# Patient Record
Sex: Female | Born: 1960 | Race: White | Hispanic: No | Smoking: Former smoker
Health system: Southern US, Community
[De-identification: ages and names within clinical notes are randomized; demographics above are authoritative.]

## PROBLEM LIST (undated history)

## (undated) DIAGNOSIS — I219 Acute myocardial infarction, unspecified: Secondary | ICD-10-CM

## (undated) DIAGNOSIS — K219 Gastro-esophageal reflux disease without esophagitis: Secondary | ICD-10-CM

## (undated) DIAGNOSIS — F319 Bipolar disorder, unspecified: Secondary | ICD-10-CM

## (undated) DIAGNOSIS — I10 Essential (primary) hypertension: Secondary | ICD-10-CM

## (undated) DIAGNOSIS — E785 Hyperlipidemia, unspecified: Secondary | ICD-10-CM

## (undated) DIAGNOSIS — F410 Panic disorder [episodic paroxysmal anxiety] without agoraphobia: Secondary | ICD-10-CM

## (undated) HISTORY — PX: ABDOMINAL HYSTERECTOMY: SHX81

## (undated) HISTORY — PX: TONSILLECTOMY: SUR1361

## (undated) HISTORY — PX: CARDIAC CATHETERIZATION: SHX172

## (undated) HISTORY — PX: HERNIA REPAIR: SHX51

## (undated) HISTORY — PX: OTHER SURGICAL HISTORY: SHX169

---

## 2001-10-09 ENCOUNTER — Ambulatory Visit (HOSPITAL_COMMUNITY): Admission: RE | Admit: 2001-10-09 | Discharge: 2001-10-09 | Payer: Self-pay | Admitting: *Deleted

## 2002-03-15 ENCOUNTER — Other Ambulatory Visit: Admission: RE | Admit: 2002-03-15 | Discharge: 2002-03-15 | Payer: Self-pay | Admitting: *Deleted

## 2002-08-17 ENCOUNTER — Encounter: Payer: Self-pay | Admitting: *Deleted

## 2002-08-17 ENCOUNTER — Ambulatory Visit (HOSPITAL_COMMUNITY): Admission: RE | Admit: 2002-08-17 | Discharge: 2002-08-17 | Payer: Self-pay | Admitting: *Deleted

## 2005-01-03 ENCOUNTER — Ambulatory Visit: Payer: Self-pay | Admitting: Internal Medicine

## 2005-08-05 ENCOUNTER — Ambulatory Visit: Payer: Self-pay | Admitting: Gynecology

## 2006-04-25 ENCOUNTER — Emergency Department: Payer: Self-pay | Admitting: Emergency Medicine

## 2006-05-19 ENCOUNTER — Ambulatory Visit: Payer: Self-pay | Admitting: Gynecology

## 2006-06-02 ENCOUNTER — Ambulatory Visit: Payer: Self-pay | Admitting: Gynecology

## 2006-06-23 ENCOUNTER — Ambulatory Visit: Payer: Self-pay | Admitting: Gynecology

## 2006-07-21 ENCOUNTER — Ambulatory Visit: Payer: Self-pay | Admitting: Gynecology

## 2006-08-19 ENCOUNTER — Ambulatory Visit: Payer: Self-pay | Admitting: Family Medicine

## 2006-09-02 ENCOUNTER — Ambulatory Visit: Payer: Self-pay | Admitting: Family Medicine

## 2006-09-02 ENCOUNTER — Inpatient Hospital Stay (HOSPITAL_COMMUNITY): Admission: AD | Admit: 2006-09-02 | Discharge: 2006-09-03 | Payer: Self-pay | Admitting: Obstetrics and Gynecology

## 2006-09-15 ENCOUNTER — Ambulatory Visit: Payer: Self-pay | Admitting: Gynecology

## 2006-09-23 ENCOUNTER — Ambulatory Visit: Payer: Self-pay | Admitting: Family Medicine

## 2006-09-27 ENCOUNTER — Inpatient Hospital Stay (HOSPITAL_COMMUNITY): Admission: AD | Admit: 2006-09-27 | Discharge: 2006-09-27 | Payer: Self-pay | Admitting: Obstetrics & Gynecology

## 2006-09-29 ENCOUNTER — Ambulatory Visit: Payer: Self-pay | Admitting: Gynecology

## 2006-10-06 ENCOUNTER — Ambulatory Visit: Payer: Self-pay | Admitting: Gynecology

## 2006-10-13 ENCOUNTER — Ambulatory Visit: Payer: Self-pay | Admitting: Gynecology

## 2006-10-16 ENCOUNTER — Inpatient Hospital Stay (HOSPITAL_COMMUNITY): Admission: AD | Admit: 2006-10-16 | Discharge: 2006-10-18 | Payer: Self-pay | Admitting: Family Medicine

## 2006-10-16 ENCOUNTER — Ambulatory Visit: Payer: Self-pay | Admitting: Obstetrics & Gynecology

## 2006-10-16 ENCOUNTER — Ambulatory Visit: Payer: Self-pay | Admitting: Family Medicine

## 2006-10-18 ENCOUNTER — Ambulatory Visit: Payer: Self-pay | Admitting: Neonatology

## 2006-10-21 ENCOUNTER — Ambulatory Visit: Payer: Self-pay | Admitting: Family Medicine

## 2006-10-24 ENCOUNTER — Ambulatory Visit: Payer: Self-pay | Admitting: Obstetrics & Gynecology

## 2006-10-28 ENCOUNTER — Ambulatory Visit: Payer: Self-pay | Admitting: Obstetrics and Gynecology

## 2006-10-31 ENCOUNTER — Ambulatory Visit: Payer: Self-pay | Admitting: Gynecology

## 2006-11-04 ENCOUNTER — Ambulatory Visit: Payer: Self-pay | Admitting: Obstetrics and Gynecology

## 2006-11-07 ENCOUNTER — Ambulatory Visit: Payer: Self-pay | Admitting: Gynecology

## 2006-11-11 ENCOUNTER — Ambulatory Visit (HOSPITAL_COMMUNITY): Admission: RE | Admit: 2006-11-11 | Discharge: 2006-11-11 | Payer: Self-pay | Admitting: Obstetrics and Gynecology

## 2006-11-11 ENCOUNTER — Ambulatory Visit: Payer: Self-pay | Admitting: Obstetrics and Gynecology

## 2006-11-14 ENCOUNTER — Ambulatory Visit: Payer: Self-pay | Admitting: Obstetrics & Gynecology

## 2006-11-14 ENCOUNTER — Ambulatory Visit: Payer: Self-pay | Admitting: Gynecology

## 2006-11-14 ENCOUNTER — Inpatient Hospital Stay (HOSPITAL_COMMUNITY): Admission: AD | Admit: 2006-11-14 | Discharge: 2006-11-17 | Payer: Self-pay | Admitting: Gynecology

## 2006-12-27 ENCOUNTER — Emergency Department: Payer: Self-pay | Admitting: Emergency Medicine

## 2007-01-05 ENCOUNTER — Ambulatory Visit: Payer: Self-pay | Admitting: Gynecology

## 2007-01-09 ENCOUNTER — Encounter: Admission: RE | Admit: 2007-01-09 | Discharge: 2007-01-09 | Payer: Self-pay | Admitting: Gynecology

## 2009-07-13 ENCOUNTER — Ambulatory Visit: Payer: Self-pay | Admitting: Obstetrics and Gynecology

## 2009-07-17 ENCOUNTER — Ambulatory Visit: Payer: Self-pay | Admitting: Obstetrics and Gynecology

## 2011-01-19 ENCOUNTER — Encounter: Payer: Self-pay | Admitting: Gynecology

## 2011-02-19 ENCOUNTER — Ambulatory Visit: Payer: Self-pay | Admitting: Unknown Physician Specialty

## 2017-07-16 ENCOUNTER — Emergency Department: Payer: Medicare Other

## 2017-07-16 ENCOUNTER — Encounter: Payer: Self-pay | Admitting: Emergency Medicine

## 2017-07-16 ENCOUNTER — Inpatient Hospital Stay
Admission: EM | Admit: 2017-07-16 | Discharge: 2017-07-18 | DRG: 247 | Disposition: A | Discharge status: Home / Self Care | Payer: Medicare Other | Attending: Internal Medicine | Admitting: Internal Medicine

## 2017-07-16 DIAGNOSIS — R0789 Other chest pain: Secondary | ICD-10-CM | POA: Diagnosis present

## 2017-07-16 DIAGNOSIS — F319 Bipolar disorder, unspecified: Secondary | ICD-10-CM | POA: Diagnosis present

## 2017-07-16 DIAGNOSIS — F410 Panic disorder [episodic paroxysmal anxiety] without agoraphobia: Secondary | ICD-10-CM | POA: Diagnosis present

## 2017-07-16 DIAGNOSIS — E669 Obesity, unspecified: Secondary | ICD-10-CM | POA: Diagnosis present

## 2017-07-16 DIAGNOSIS — K219 Gastro-esophageal reflux disease without esophagitis: Secondary | ICD-10-CM | POA: Diagnosis present

## 2017-07-16 DIAGNOSIS — Z888 Allergy status to other drugs, medicaments and biological substances status: Secondary | ICD-10-CM

## 2017-07-16 DIAGNOSIS — F419 Anxiety disorder, unspecified: Secondary | ICD-10-CM | POA: Diagnosis present

## 2017-07-16 DIAGNOSIS — Z880 Allergy status to penicillin: Secondary | ICD-10-CM | POA: Diagnosis not present

## 2017-07-16 DIAGNOSIS — I214 Non-ST elevation (NSTEMI) myocardial infarction: Principal | ICD-10-CM | POA: Diagnosis present

## 2017-07-16 DIAGNOSIS — I10 Essential (primary) hypertension: Secondary | ICD-10-CM | POA: Diagnosis present

## 2017-07-16 DIAGNOSIS — Z87891 Personal history of nicotine dependence: Secondary | ICD-10-CM | POA: Diagnosis not present

## 2017-07-16 DIAGNOSIS — E785 Hyperlipidemia, unspecified: Secondary | ICD-10-CM | POA: Diagnosis present

## 2017-07-16 DIAGNOSIS — N3000 Acute cystitis without hematuria: Secondary | ICD-10-CM | POA: Diagnosis present

## 2017-07-16 DIAGNOSIS — Z6834 Body mass index (BMI) 34.0-34.9, adult: Secondary | ICD-10-CM

## 2017-07-16 DIAGNOSIS — F411 Generalized anxiety disorder: Secondary | ICD-10-CM | POA: Diagnosis present

## 2017-07-16 DIAGNOSIS — Z79899 Other long term (current) drug therapy: Secondary | ICD-10-CM

## 2017-07-16 DIAGNOSIS — R109 Unspecified abdominal pain: Secondary | ICD-10-CM

## 2017-07-16 HISTORY — DX: Bipolar disorder, unspecified: F31.9

## 2017-07-16 HISTORY — DX: Gastro-esophageal reflux disease without esophagitis: K21.9

## 2017-07-16 HISTORY — DX: Essential (primary) hypertension: I10

## 2017-07-16 HISTORY — DX: Panic disorder (episodic paroxysmal anxiety): F41.0

## 2017-07-16 LAB — COMPREHENSIVE METABOLIC PANEL
ALBUMIN: 4.4 g/dL (ref 3.5–5.0)
ALT: 54 U/L (ref 14–54)
ANION GAP: 9 (ref 5–15)
AST: 61 U/L — ABNORMAL HIGH (ref 15–41)
Alkaline Phosphatase: 122 U/L (ref 38–126)
BUN: 14 mg/dL (ref 6–20)
CHLORIDE: 107 mmol/L (ref 101–111)
CO2: 25 mmol/L (ref 22–32)
CREATININE: 1.01 mg/dL — AB (ref 0.44–1.00)
Calcium: 9.5 mg/dL (ref 8.9–10.3)
GFR calc non Af Amer: >60 mL/min (ref >60)
Glucose, Bld: 113 mg/dL — ABNORMAL HIGH (ref 65–99)
Potassium: 4.5 mmol/L (ref 3.5–5.1)
SODIUM: 141 mmol/L (ref 135–145)
Total Bilirubin: 1.1 mg/dL (ref 0.3–1.2)
Total Protein: 7.9 g/dL (ref 6.5–8.1)

## 2017-07-16 LAB — TSH: TSH: 2.296 u[IU]/mL (ref 0.350–4.500)

## 2017-07-16 LAB — TROPONIN I
TROPONIN I: 2.87 ng/mL — AB (ref <0.03)
TROPONIN I: 2.99 ng/mL — AB (ref <0.03)
Troponin I: 2.99 ng/mL (ref <0.03)
Troponin I: 2.2 ng/mL (ref <0.03)

## 2017-07-16 LAB — URINALYSIS, COMPLETE (UACMP) WITH MICROSCOPIC
Bilirubin Urine: NEGATIVE
GLUCOSE, UA: NEGATIVE mg/dL
KETONES UR: NEGATIVE mg/dL
Nitrite: NEGATIVE
PROTEIN: NEGATIVE mg/dL
Specific Gravity, Urine: 1.008 (ref 1.005–1.030)
pH: 7 (ref 5.0–8.0)

## 2017-07-16 LAB — CBC
HCT: 43.4 % (ref 35.0–47.0)
Hemoglobin: 14.9 g/dL (ref 12.0–16.0)
MCH: 29.3 pg (ref 26.0–34.0)
MCHC: 34.3 g/dL (ref 32.0–36.0)
MCV: 85.3 fL (ref 80.0–100.0)
PLATELETS: 309 10*3/uL (ref 150–440)
RBC: 5.08 MIL/uL (ref 3.80–5.20)
RDW: 13.3 % (ref 11.5–14.5)
WBC: 11.4 10*3/uL — ABNORMAL HIGH (ref 3.6–11.0)

## 2017-07-16 LAB — HEPARIN LEVEL (UNFRACTIONATED)
HEPARIN UNFRACTIONATED: 0.5 [IU]/mL (ref 0.30–0.70)
Heparin Unfractionated: 0.38 IU/mL (ref 0.30–0.70)

## 2017-07-16 LAB — PROTIME-INR
INR: 0.99
PROTHROMBIN TIME: 13.1 s (ref 11.4–15.2)

## 2017-07-16 LAB — APTT: aPTT: 28 seconds (ref 24–36)

## 2017-07-16 LAB — LIPASE, BLOOD: Lipase: 32 U/L (ref 11–51)

## 2017-07-16 MED ORDER — LORAZEPAM 0.5 MG PO TABS
0.5000 mg | ORAL_TABLET | Freq: Once | ORAL | Status: AC
  Administered 2017-07-16: 0.5 mg via ORAL
  Filled 2017-07-16: qty 1

## 2017-07-16 MED ORDER — ATORVASTATIN CALCIUM 20 MG PO TABS
80.0000 mg | ORAL_TABLET | Freq: Every day | ORAL | Status: DC
  Administered 2017-07-16 – 2017-07-17 (×2): 80 mg via ORAL
  Filled 2017-07-16 (×2): qty 4

## 2017-07-16 MED ORDER — FAMOTIDINE 20 MG PO TABS
20.0000 mg | ORAL_TABLET | Freq: Two times a day (BID) | ORAL | Status: DC
  Administered 2017-07-16 – 2017-07-18 (×5): 20 mg via ORAL
  Filled 2017-07-16 (×5): qty 1

## 2017-07-16 MED ORDER — CIPROFLOXACIN IN D5W 400 MG/200ML IV SOLN
400.0000 mg | Freq: Once | INTRAVENOUS | Status: AC
  Administered 2017-07-16: 400 mg via INTRAVENOUS
  Filled 2017-07-16: qty 200

## 2017-07-16 MED ORDER — CIPROFLOXACIN IN D5W 400 MG/200ML IV SOLN
400.0000 mg | Freq: Two times a day (BID) | INTRAVENOUS | Status: DC
Start: 2017-07-16 — End: 2017-07-16

## 2017-07-16 MED ORDER — DIVALPROEX SODIUM ER 250 MG PO TB24
250.0000 mg | ORAL_TABLET | Freq: Every day | ORAL | Status: DC
  Administered 2017-07-16 – 2017-07-18 (×3): 250 mg via ORAL
  Filled 2017-07-16 (×4): qty 1

## 2017-07-16 MED ORDER — METOPROLOL TARTRATE 25 MG PO TABS
25.0000 mg | ORAL_TABLET | Freq: Two times a day (BID) | ORAL | Status: DC
  Administered 2017-07-16 – 2017-07-17 (×3): 25 mg via ORAL
  Filled 2017-07-16 (×5): qty 1

## 2017-07-16 MED ORDER — HEPARIN BOLUS VIA INFUSION
4000.0000 [IU] | Freq: Once | INTRAVENOUS | Status: AC
  Administered 2017-07-16: 4000 [IU] via INTRAVENOUS
  Filled 2017-07-16: qty 4000

## 2017-07-16 MED ORDER — PANTOPRAZOLE SODIUM 40 MG PO TBEC
40.0000 mg | DELAYED_RELEASE_TABLET | Freq: Two times a day (BID) | ORAL | Status: DC
  Administered 2017-07-16 – 2017-07-18 (×5): 40 mg via ORAL
  Filled 2017-07-16 (×5): qty 1

## 2017-07-16 MED ORDER — SODIUM CHLORIDE 0.9 % IV SOLN
INTRAVENOUS | Status: DC
  Administered 2017-07-16 – 2017-07-18 (×3): via INTRAVENOUS

## 2017-07-16 MED ORDER — ASPIRIN 81 MG PO CHEW
324.0000 mg | CHEWABLE_TABLET | Freq: Once | ORAL | Status: AC
  Administered 2017-07-16: 324 mg via ORAL
  Filled 2017-07-16: qty 4

## 2017-07-16 MED ORDER — HEPARIN (PORCINE) IN NACL 100-0.45 UNIT/ML-% IJ SOLN
1000.0000 [IU]/h | INTRAMUSCULAR | Status: DC
  Administered 2017-07-16 – 2017-07-17 (×3): 1000 [IU]/h via INTRAVENOUS
  Filled 2017-07-16 (×2): qty 250

## 2017-07-16 MED ORDER — HYDROCODONE-ACETAMINOPHEN 5-325 MG PO TABS: 1.0000 | ORAL_TABLET | ORAL | Status: DC | PRN

## 2017-07-16 MED ORDER — CIPROFLOXACIN IN D5W 400 MG/200ML IV SOLN
400.0000 mg | Freq: Two times a day (BID) | INTRAVENOUS | Status: DC
  Administered 2017-07-16 – 2017-07-18 (×4): 400 mg via INTRAVENOUS
  Filled 2017-07-16 (×5): qty 200

## 2017-07-16 MED ORDER — ONDANSETRON HCL 4 MG PO TABS: 4.0000 mg | ORAL_TABLET | Freq: Four times a day (QID) | ORAL | Status: DC | PRN

## 2017-07-16 MED ORDER — ATORVASTATIN CALCIUM 20 MG PO TABS: 40.0000 mg | ORAL_TABLET | Freq: Every day | ORAL | Status: DC

## 2017-07-16 MED ORDER — SODIUM CHLORIDE 0.9 % IV BOLUS (SEPSIS)
1000.0000 mL | Freq: Once | INTRAVENOUS | Status: AC
  Administered 2017-07-16: 1000 mL via INTRAVENOUS

## 2017-07-16 MED ORDER — CLONAZEPAM 0.5 MG PO TABS
0.2500 mg | ORAL_TABLET | Freq: Two times a day (BID) | ORAL | Status: DC
  Administered 2017-07-16 – 2017-07-17 (×3): 0.25 mg via ORAL
  Filled 2017-07-16 (×5): qty 1

## 2017-07-16 MED ORDER — ACETAMINOPHEN 325 MG PO TABS: 650.0000 mg | ORAL_TABLET | Freq: Four times a day (QID) | ORAL | Status: DC | PRN

## 2017-07-16 MED ORDER — ASPIRIN EC 325 MG PO TBEC
325.0000 mg | DELAYED_RELEASE_TABLET | Freq: Every day | ORAL | Status: DC
  Filled 2017-07-16: qty 1

## 2017-07-16 MED ORDER — LORAZEPAM 2 MG/ML IJ SOLN
2.0000 mg | INTRAMUSCULAR | Status: DC | PRN
  Administered 2017-07-16 – 2017-07-17 (×2): 2 mg via INTRAVENOUS
  Filled 2017-07-16 (×2): qty 1

## 2017-07-16 MED ORDER — ONDANSETRON HCL 4 MG/2ML IJ SOLN: 4.0000 mg | Freq: Four times a day (QID) | INTRAMUSCULAR | Status: DC | PRN

## 2017-07-16 MED ORDER — LORAZEPAM 1 MG PO TABS
1.0000 mg | ORAL_TABLET | Freq: Once | ORAL | Status: AC
  Administered 2017-07-16: 1 mg via ORAL
  Filled 2017-07-16: qty 1

## 2017-07-16 MED ORDER — ACETAMINOPHEN 650 MG RE SUPP: 650.0000 mg | Freq: Four times a day (QID) | RECTAL | Status: DC | PRN

## 2017-07-16 NOTE — Progress Notes (Signed)
CH made a follow up with pt whom the [facility] of the unit had met earlier and educated about AD. Pt was asleep at the time of this visit. CH was not able to talk to pt and will plan another follow up visit as needed.   07/16/17 1300  Clinical Encounter Type  Visited With Patient  Visit Type Follow-up  Referral From Chaplain  Consult/Referral To Chaplain  Spiritual Encounters  Spiritual Needs Other (Comment)

## 2017-07-16 NOTE — ED Notes (Signed)
Patient assisted to use restroom. Steady gait noted. Patient reports no further needs at this time.

## 2017-07-16 NOTE — ED Triage Notes (Signed)
Pt here for intermittent episodes of sweating followed by epigastric pain that radiates to back. Sx X 2 days. Eases with ambulation

## 2017-07-16 NOTE — Progress Notes (Signed)
   07/16/17 1139  Clinical Encounter Type  Visited With Patient;Family;Health care provider  Visit Type Initial;Spiritual support  Referral From Chaplain;Nurse   [facility] responded to request for AD. Patient was with staff, family, pastor, and children. [place] spoke with the patient and nurse will call [place] later to visit.

## 2017-07-16 NOTE — Progress Notes (Signed)
ANTICOAGULATION CONSULT NOTE - Initial Consult  Pharmacy Consult for heparin Indication: chest pain/ACS  Allergies  Allergen Reactions  . Other Anaphylaxis    Ex lax  . Penicillins Rash    .Has patient had a PCN reaction causing immediate rash, facial/tongue/throat swelling, SOB or lightheadedness with hypotension: No Has patient had a PCN reaction causing severe rash involving mucus membranes or skin necrosis: No Has patient had a PCN reaction that required hospitalization: No Has patient had a PCN reaction occurring within the last 10 years: No If all of the above answers are "NO", then may proceed with Cephalosporin use.     Patient Measurements: Height: 5\' 3"  (160 cm) Weight: 193 lb (87.5 kg) IBW/kg (Calculated) : 52.4 Heparin Dosing Weight: 72.1 kg  Vital Signs: Temp: 97.9 F (36.6 C) (07/18 1155) Temp Source: Oral (07/18 1155) BP: 137/72 (07/18 1155) Pulse Rate: 98 (07/18 1155)  Labs:  Recent Labs  07/16/17 0827 07/16/17 1007 07/16/17 1047 07/16/17 1132 07/16/17 1713  HGB 14.9  --   --   --   --   HCT 43.4  --   --   --   --   PLT 309  --   --   --   --   APTT  --   --  28  --   --   LABPROT  --   --  13.1  --   --   INR  --   --  0.99  --   --   HEPARINUNFRC  --   --   --   --  0.50  CREATININE 1.01*  --   --   --   --   TROPONINI 2.87* 2.99*  --  2.99* 2.20*    Estimated Creatinine Clearance: 63.6 mL/min (A) (by C-G formula based on SCr of 1.01 mg/dL (H)).   Medical History: Past Medical History:  Diagnosis Date  . Bipolar affective disorder ([place])   . GERD (gastroesophageal reflux disease)   . Hypertension   . Panic attack     Medications:  Infusions:  . sodium chloride 75 mL/hr at 07/16/17 1229  . ciprofloxacin    . heparin 1,000 Units/hr (07/16/17 1600)    Assessment: 56 yof cc abdominal pain (presenting with GERD like reflux symptoms: burning x 2 days, aching substernal pain radiating to back). PMH bipolar affective disorder,ax, GERD.  Troponin in ED 2.87 x 1. Pharmacy consulted to dose UFH for ACS. No OAC on PTA list.   Goal of Therapy:  Heparin level 0.3-0.7 units/ml Monitor platelets by anticoagulation protocol: Yes   Plan:  Continue heparin infusion at current rate and recheck a HL in 6 hours.   Ulice Dash D, Pharm.D., BCPS Clinical Pharmacist 07/16/2017,6:33 PM

## 2017-07-16 NOTE — ED Notes (Signed)
Pt assisted to the toilet. Pt advised that she needed something for her nerves and pt stated that she is burping a lot again. Nurse advised

## 2017-07-16 NOTE — ED Provider Notes (Addendum)
[facility] Emergency Department Provider Note  ____________________________________________  Time seen: Approximately 8:04 AM  I have reviewed the triage vital signs and the nursing notes.   HISTORY  Chief Complaint Abdominal Pain    HPI Amber Mills is a 56 y.o. female with a history of difficult to control GERD, obesity, HTN, anxiety and bipolar affective disorder presenting with chest pain. The patient reports that 2 nights ago she turned over on her side when she developed a central "deep hurting" pain in the chest.She did not have any associated diaphoresis, radiation, pleuritic component, shortness of breath, palpitations, lightheadedness or syncope. The pain resolved when she sat up. This morning at 4:30 AM, the patient and related to the bathroom and developed a similar pain that then again self resolved. At this time, the patient is pain-free. She describes that she has difficult to control GERD, and has 5-6 episodes daily of a burning epigastric sensation; the pain for which she presents is not similar to this. The patient has not had any risk stratification studies in the last 12 months. She has undergone endoscopy, which she reports was normal approximately one year ago.  FH: father MI's 72's; mother stents 8's  SH: No tobacco or cocaine   Past Medical History:  Diagnosis Date  . Bipolar affective disorder ([place])   . Hypertension   . Panic attack     There are no active problems to display for this patient.   Past Surgical History:  Procedure Laterality Date  . ABDOMINAL HYSTERECTOMY    . bladder mesh    . HERNIA REPAIR    . TONSILLECTOMY        Allergies Other and Penicillins  History reviewed. No pertinent family history.  Social History Social History  Substance Use Topics  . Smoking status: Former Research scientist (life sciences)  . Smokeless tobacco: Never Used  . Alcohol use No    Review of Systems Constitutional: No fever/chills.No  lightheadedness or syncope. Eyes: No visual changes. ENT: No sore throat. No congestion or rhinorrhea. Cardiovascular: Positive central chest pain. Denies palpitations. Respiratory: Denies shortness of breath.  No cough. Gastrointestinal: Positive frequent chronic epigastric abdominal burning sensation.  No nausea, no vomiting.  No diarrhea.  No constipation. Genitourinary: Negative for dysuria. Musculoskeletal: Negative for back pain. Skin: Negative for rash. Neurological: Negative for headaches. No focal numbness, tingling or weakness.     ____________________________________________   PHYSICAL EXAM:  VITAL SIGNS: ED Triage Vitals  Enc Vitals Group     BP 07/16/17 0727 (!) 143/63     Pulse Rate 07/16/17 0725 (!) 112     Resp 07/16/17 0725 20     Temp 07/16/17 0725 97.8 F (36.6 C)     Temp Source 07/16/17 0725 Oral     SpO2 07/16/17 0725 99 %     Weight 07/16/17 0718 193 lb (87.5 kg)     Height 07/16/17 0718 5\' 3"  (1.6 m)     Head Circumference --      Peak Flow --      Pain Score --      Pain Loc --      Pain Edu? --      Excl. in [place]? --     Constitutional: Alert and oriented. Anxious appearing but in no acute distress. Answers questions appropriately. Eyes: Conjunctivae are normal.  EOMI. No scleral icterus. Head: Atraumatic. Nose: No congestion/rhinnorhea. Mouth/Throat: Mucous membranes are moist.  Neck: No stridor.  Supple.  No JVD. No meningismus.  Cardiovascular: Normal rate, regular rhythm. No murmurs, rubs or gallops.  Respiratory: Normal respiratory effort.  No accessory muscle use or retractions. Lungs CTAB.  No wheezes, rales or ronchi. Gastrointestinal: Overweight. Soft, nontender and nondistended.  No guarding or rebound.  No peritoneal signs. Musculoskeletal: No LE edema. No ttp in the calves or palpable cords.  Negative Homan's sign. Neurologic:  A&Ox3.  Speech is clear.  Face and smile are symmetric.  EOMI.  Moves all extremities well. Skin:  Skin is  warm, dry and intact. No rash noted. Psychiatric: The patient has an anxious mood and affect with mildly pressured speech. Behavior is normal.  Normal judgement.  ____________________________________________   LABS (all labs ordered are listed, but only abnormal results are displayed)  Labs Reviewed  CBC - Abnormal; Notable for the following:       Result Value   WBC 11.4 (*)    All other components within normal limits  COMPREHENSIVE METABOLIC PANEL - Abnormal; Notable for the following:    Glucose, Bld 113 (*)    Creatinine, Ser 1.01 (*)    AST 61 (*)    All other components within normal limits  TROPONIN I - Abnormal; Notable for the following:    Troponin I 2.87 (*)    All other components within normal limits  URINALYSIS, COMPLETE (UACMP) WITH MICROSCOPIC - Abnormal; Notable for the following:    Color, Urine YELLOW (*)    APPearance CLEAR (*)    Hgb urine dipstick SMALL (*)    Leukocytes, UA LARGE (*)    Bacteria, UA MANY (*)    Squamous Epithelial / LPF 0-5 (*)    All other components within normal limits  URINE CULTURE  LIPASE, BLOOD  TROPONIN I   ____________________________________________  EKG  ED ECG REPORT I, Eula Listen, the attending physician, personally viewed and interpreted this ECG.   Date: 07/16/2017  EKG Time: 724  Rate: 106  Rhythm: sinus tachycardia  Axis: normal  Intervals:none  ST&T Change: Isolated 0.52mm ST elevation in V2; no STEMI  ____________________________________________  RADIOLOGY  Dg Chest 2 View  Result Date: 07/16/2017 CLINICAL DATA:  Midsternal chest pain for several days EXAM: CHEST  2 VIEW COMPARISON:  None. FINDINGS: The heart size and mediastinal contours are within normal limits. Both lungs are clear. The visualized skeletal structures are unremarkable. IMPRESSION: No active cardiopulmonary disease. Electronically Signed   By: Inez Catalina M.D.   On: 07/16/2017 08:32     ____________________________________________   PROCEDURES  Procedure(s) performed: None  Procedures  Critical Care performed: Yes ____________________________________________   INITIAL IMPRESSION / ASSESSMENT AND PLAN / ED COURSE  Pertinent labs & imaging results that were available during my care of the patient were reviewed by me and considered in my medical decision making (see chart for details).  56 y.o. female with several cardiac risk factors presenting with 2 episodes of atypical chest pain. Overall, the patient is anxious with some associated hypertension and tachycardia. We will evaluate her for ACS or MI with serial EKGs and troponins. If her workup in the emergency department is reassuring, I will plan to have her see GI as an outpatient for possible endoscopy to evaluate for reflux, peptic ulcer disease, gastric ulcer disease. She will also be referred to cardiology for an outpatient stress test and further risk stratification.  ----------------------------------------- 9:12 AM on 07/16/2017 -----------------------------------------  The patient does have a positive troponin of greater than 2. At this time she is not having chest pains  or acute heparinization is not indicated. She has received aspirin and will be admitted to the hospital for further evaluation and treatment., The patient does have a urinary tract infection and will be treated with ciprofloxacin.  ----------------------------------------- 9:18 AM on 07/16/2017 -----------------------------------------  The patient states that she is feeling slightly better after the aspirin and Ativan. She still continues to be chest pain-free. However, she is significantly anxious and when I give her the results of her testing, she became more anxious and states that she has a she is having a panic attack. I will treat her with Ativan and proceed with admission.  CRITICAL CARE Performed by: Eula Listen   Total critical care time: 40 minutes  Critical care time was exclusive of separately billable procedures and treating other patients.  Critical care was necessary to treat or prevent imminent or life-threatening deterioration.  Critical care was time spent personally by me on the following activities: development of treatment plan with patient and/or surrogate as well as nursing, discussions with consultants, evaluation of patient's response to treatment, examination of patient, obtaining history from patient or surrogate, ordering and performing treatments and interventions, ordering and review of laboratory studies, ordering and review of radiographic studies, pulse oximetry and re-evaluation of patient's condition.  ____________________________________________  FINAL CLINICAL IMPRESSION(S) / ED DIAGNOSES  Final diagnoses:  NSTEMI (non-ST elevated myocardial infarction) ([place])  Acute cystitis without hematuria  Anxiety         NEW MEDICATIONS STARTED DURING THIS VISIT:  New Prescriptions   No medications on file      Eula Listen, MD 07/16/17 1610    Eula Listen, MD 07/16/17 9604    Eula Listen, MD 07/16/17 [phone]

## 2017-07-16 NOTE — Progress Notes (Signed)
ANTICOAGULATION CONSULT NOTE - Initial Consult  Pharmacy Consult for heparin Indication: chest pain/ACS  Allergies  Allergen Reactions  . Other Anaphylaxis    Ex lax  . Penicillins Rash    .Has patient had a PCN reaction causing immediate rash, facial/tongue/throat swelling, SOB or lightheadedness with hypotension: No Has patient had a PCN reaction causing severe rash involving mucus membranes or skin necrosis: No Has patient had a PCN reaction that required hospitalization: No Has patient had a PCN reaction occurring within the last 10 years: No If all of the above answers are "NO", then may proceed with Cephalosporin use.     Patient Measurements: Height: 5\' 3"  (160 cm) Weight: 193 lb (87.5 kg) IBW/kg (Calculated) : 52.4 Heparin Dosing Weight: 72.1 kg  Vital Signs: Temp: 97.8 F (36.6 C) (07/18 0725) Temp Source: Oral (07/18 0725) BP: 142/73 (07/18 0800) Pulse Rate: 101 (07/18 0800)  Labs:  Recent Labs  07/16/17 0827  HGB 14.9  HCT 43.4  PLT 309  CREATININE 1.01*  TROPONINI 2.87*    Estimated Creatinine Clearance: 63.6 mL/min (A) (by C-G formula based on SCr of 1.01 mg/dL (H)).   Medical History: Past Medical History:  Diagnosis Date  . Bipolar affective disorder ([place])   . GERD (gastroesophageal reflux disease)   . Hypertension   . Panic attack     Medications:  Infusions:  . ciprofloxacin 400 mg (07/16/17 0950)  . heparin      Assessment: 56 yof cc abdominal pain (presenting with GERD like reflux symptoms: burning x 2 days, aching substernal pain radiating to back). PMH bipolar affective disorder,ax, GERD. Troponin in ED 2.87 x 1. Pharmacy consulted to dose UFH for ACS. No OAC on PTA list.   Goal of Therapy:  Heparin level 0.3-0.7 units/ml Monitor platelets by anticoagulation protocol: Yes   Plan:  Give 4000 units bolus x 1 Start heparin infusion at 900 units/hr Check anti-Xa level in 6 hours and daily while on heparin Continue to monitor H&H  and platelets  Laural Benes, Pharm.D., BCPS Clinical Pharmacist 07/16/2017,10:25 AM

## 2017-07-16 NOTE — Consult Note (Signed)
Reason for Consult:NSTEMI [place] Referring Physician: Dr Fritzi Mandes  Amber Mills is an 56 y.o. female.  HPI: Pt is a  56 y/o who c/o of recent angina symptoms. She has been having cp sob . She c/o radiation of pain to her back. Symptoms started Monday while in bed. She got up to go to the bathroom when she noticed cp.The patient was seen in the ER and found to have an elevatrd troponin but benign EKG.  Past Medical History:  Diagnosis Date  . Bipolar affective disorder ([place])   . GERD (gastroesophageal reflux disease)   . Hypertension   . Panic attack     Past Surgical History:  Procedure Laterality Date  . ABDOMINAL HYSTERECTOMY    . bladder mesh    . HERNIA REPAIR    . TONSILLECTOMY      Family History  Problem Relation Age of Onset  . CAD Father     Social History:  reports that she has quit smoking. She has never used smokeless tobacco. She reports that she does not drink alcohol or use drugs.  Allergies:  Allergies  Allergen Reactions  . Other Anaphylaxis    Ex lax  . Penicillins Rash    .Has patient had a PCN reaction causing immediate rash, facial/tongue/throat swelling, SOB or lightheadedness with hypotension: No Has patient had a PCN reaction causing severe rash involving mucus membranes or skin necrosis: No Has patient had a PCN reaction that required hospitalization: No Has patient had a PCN reaction occurring within the last 10 years: No If all of the above answers are "NO", then may proceed with Cephalosporin use.     Medications: I have reviewed the patient's current medications.  Results for orders placed or performed during the hospital encounter of 07/16/17 (from the past 48 hour(s))  CBC     Status: Abnormal   Collection Time: 07/16/17  8:27 AM  Result Value Ref Range   WBC 11.4 (H) 3.6 - 11.0 K/uL   RBC 5.08 3.80 - 5.20 MIL/uL   Hemoglobin 14.9 12.0 - 16.0 g/dL   HCT 43.4 35.0 - 47.0 %   MCV 85.3 80.0 - 100.0 fL   MCH 29.3 26.0 - 34.0 pg    MCHC 34.3 32.0 - 36.0 g/dL   RDW 13.3 11.5 - 14.5 %   Platelets 309 150 - 440 K/uL  Comprehensive metabolic panel     Status: Abnormal   Collection Time: 07/16/17  8:27 AM  Result Value Ref Range   Sodium 141 135 - 145 mmol/L   Potassium 4.5 3.5 - 5.1 mmol/L    Comment: HEMOLYSIS AT THIS LEVEL MAY AFFECT RESULT   Chloride 107 101 - 111 mmol/L   CO2 25 22 - 32 mmol/L   Glucose, Bld 113 (H) 65 - 99 mg/dL   BUN 14 6 - 20 mg/dL   Creatinine, Ser 1.01 (H) 0.44 - 1.00 mg/dL   Calcium 9.5 8.9 - 10.3 mg/dL   Total Protein 7.9 6.5 - 8.1 g/dL   Albumin 4.4 3.5 - 5.0 g/dL   AST 61 (H) 15 - 41 U/L   ALT 54 14 - 54 U/L   Alkaline Phosphatase 122 38 - 126 U/L   Total Bilirubin 1.1 0.3 - 1.2 mg/dL   GFR calc non Af Amer >60 >60 mL/min   GFR calc Af Amer >60 >60 mL/min    Comment: (NOTE) The eGFR has been calculated using the CKD EPI equation. This calculation has not been  validated in all clinical situations. eGFR's persistently <60 mL/min signify possible Chronic Kidney Disease.    Anion gap 9 5 - 15  Lipase, blood     Status: None   Collection Time: 07/16/17  8:27 AM  Result Value Ref Range   Lipase 32 11 - 51 U/L  Troponin I     Status: Abnormal   Collection Time: 07/16/17  8:27 AM  Result Value Ref Range   Troponin I 2.87 (HH) <0.03 ng/mL    Comment: CRITICAL RESULT CALLED TO, READ BACK BY AND VERIFIED WITH LAURA CATES AT 0910 07/16/17 DAS   Urinalysis, Complete w Microscopic     Status: Abnormal   Collection Time: 07/16/17  8:31 AM  Result Value Ref Range   Color, Urine YELLOW (A) YELLOW   APPearance CLEAR (A) CLEAR   Specific Gravity, Urine 1.008 1.005 - 1.030   pH 7.0 5.0 - 8.0   Glucose, UA NEGATIVE NEGATIVE mg/dL   Hgb urine dipstick SMALL (A) NEGATIVE   Bilirubin Urine NEGATIVE NEGATIVE   Ketones, ur NEGATIVE NEGATIVE mg/dL   Protein, ur NEGATIVE NEGATIVE mg/dL   Nitrite NEGATIVE NEGATIVE   Leukocytes, UA LARGE (A) NEGATIVE   RBC / HPF 0-5 0 - 5 RBC/hpf   WBC, UA TOO  NUMEROUS TO COUNT 0 - 5 WBC/hpf   Bacteria, UA MANY (A) NONE SEEN   Squamous Epithelial / LPF 0-5 (A) NONE SEEN  Troponin I     Status: Abnormal   Collection Time: 07/16/17 10:07 AM  Result Value Ref Range   Troponin I 2.99 (HH) <0.03 ng/mL    Comment: CRITICAL VALUE NOTED. VALUE IS CONSISTENT WITH PREVIOUSLY REPORTED/CALLED VALUE DAS  APTT     Status: None   Collection Time: 07/16/17 10:47 AM  Result Value Ref Range   aPTT 28 24 - 36 seconds  Protime-INR     Status: None   Collection Time: 07/16/17 10:47 AM  Result Value Ref Range   Prothrombin Time 13.1 11.4 - 15.2 seconds   INR 0.99   TSH     Status: None   Collection Time: 07/16/17 11:32 AM  Result Value Ref Range   TSH 2.296 0.350 - 4.500 uIU/mL    Comment: Performed by a 3rd Generation assay with a functional sensitivity of <=0.01 uIU/mL.  Troponin I     Status: Abnormal   Collection Time: 07/16/17 11:32 AM  Result Value Ref Range   Troponin I 2.99 (HH) <0.03 ng/mL    Comment: CRITICAL VALUE NOTED. VALUE IS CONSISTENT WITH PREVIOUSLY REPORTED/CALLED VALUE DAS  Heparin level (unfractionated)     Status: None   Collection Time: 07/16/17  5:13 PM  Result Value Ref Range   Heparin Unfractionated 0.50 0.30 - 0.70 IU/mL    Comment:        IF HEPARIN RESULTS ARE BELOW EXPECTED VALUES, AND PATIENT DOSAGE HAS BEEN CONFIRMED, SUGGEST FOLLOW UP TESTING OF ANTITHROMBIN III LEVELS.   Troponin I     Status: Abnormal   Collection Time: 07/16/17  5:13 PM  Result Value Ref Range   Troponin I 2.20 (HH) <0.03 ng/mL    Comment: CRITICAL VALUE NOTED. VALUE IS CONSISTENT WITH PREVIOUSLY REPORTED/CALLED VALUE JJB    Dg Chest 2 View  Result Date: 07/16/2017 CLINICAL DATA:  Midsternal chest pain for several days EXAM: CHEST  2 VIEW COMPARISON:  None. FINDINGS: The heart size and mediastinal contours are within normal limits. Both lungs are clear. The visualized skeletal structures are unremarkable. IMPRESSION:  No active cardiopulmonary  disease. Electronically Signed   By: Inez Catalina M.D.   On: 07/16/2017 08:32    Review of Systems  Constitutional: Positive for diaphoresis and malaise/fatigue.  HENT: Positive for congestion.   Eyes: Negative.   Respiratory: Positive for shortness of breath.   Cardiovascular: Positive for chest pain and palpitations.  Gastrointestinal: Positive for heartburn.  Genitourinary: Negative.   Skin: Negative.   Neurological: Positive for tingling and weakness.  Endo/Heme/Allergies: Negative.   Psychiatric/Behavioral: Negative.    Blood pressure 124/69, pulse 80, temperature 98.2 F (36.8 C), temperature source Oral, resp. rate 17, height 5' 3"  (1.6 m), weight 87.5 kg (193 lb), SpO2 96 %. Physical Exam  Nursing note and vitals reviewed. Constitutional: She is oriented to person, place, and time. She appears well-developed and well-nourished.  HENT:  Head: Normocephalic and atraumatic.  Eyes: Pupils are equal, round, and reactive to light. Conjunctivae and EOM are normal.  Neck: Normal range of motion. Neck supple.  Cardiovascular: Normal rate, regular rhythm and normal heart sounds.   Respiratory: Effort normal and breath sounds normal.  GI: Soft. Bowel sounds are normal.  Musculoskeletal: Normal range of motion.  Neurological: She is alert and oriented to person, place, and time.  Skin: Skin is warm and dry.  Psychiatric: She has a normal mood and affect.    Assessment/Plan: NSTEMI [place] Obesity Hyperlipidemia GERD HTN Elevated troponin . PLAN Agree with R/OMI Continue heparin IV Agree with b-blockers Continue statin therapy ECHO for evaluation of LVF/Valvular structures Recommend cardiac cath for NSTEMI   Glenford Garis D Kyzen Horn 07/16/2017, 11:20 PM

## 2017-07-16 NOTE — H&P (Addendum)
[facility] NAME: Amber Mills    MR#:  [id number]  DATE OF BIRTH:  04/18/1961  DATE OF ADMISSION:  07/16/2017  PRIMARY CARE PHYSICIAN: Patient, No Pcp Per   REQUESTING/REFERRING PHYSICIAN: Marsa Aris MD  CHIEF COMPLAINT:   Chief Complaint  Patient presents with  . Abdominal Pain    HISTORY OF PRESENT ILLNESS: Amber Mills  is a 56 y.o. female with a known history of Bipolar affective disorder, anxiety, GERD who has had chronic issues with GERD with reflux like symptoms and burning in the abdomen who 2 days ago while going to the bathroom at night developed aching substernal pain going to her back. After a while that subsided. Patient didn't have any further symptoms and last night again started having the same symptoms with the chest pain radiating to her back. She also had some feeling of fluttering in her heart. She did not have radiation of the pain to her arm or jaw. She did not have any shortness of breath. Patient does have history of anxiety and has been under a lot of stress over the past multiple years. She denies any nausea vomiting or diarrhea.      PAST MEDICAL HISTORY:   Past Medical History:  Diagnosis Date  . Bipolar affective disorder ([place])   . GERD (gastroesophageal reflux disease)   . Hypertension   . Panic attack     PAST SURGICAL HISTORY:  Past Surgical History:  Procedure Laterality Date  . ABDOMINAL HYSTERECTOMY    . bladder mesh    . HERNIA REPAIR    . TONSILLECTOMY      SOCIAL HISTORY:  Social History  Substance Use Topics  . Smoking status: Former Research scientist (life sciences)  . Smokeless tobacco: Never Used  . Alcohol use No    FAMILY HISTORY:  Family History  Problem Relation Age of Onset  . CAD Father     DRUG ALLERGIES:  Allergies  Allergen Reactions  . Other Anaphylaxis    Ex lax  . Penicillins Rash    .Has patient had a PCN reaction causing immediate rash, facial/tongue/throat swelling, SOB or  lightheadedness with hypotension: No Has patient had a PCN reaction causing severe rash involving mucus membranes or skin necrosis: No Has patient had a PCN reaction that required hospitalization: No Has patient had a PCN reaction occurring within the last 10 years: No If all of the above answers are "NO", then may proceed with Cephalosporin use.     REVIEW OF SYSTEMS:   CONSTITUTIONAL: No fever, fatigue or weakness.  EYES: No blurred or double vision.  EARS, NOSE, AND THROAT: No tinnitus or ear pain.  RESPIRATORY: No cough, shortness of breath, wheezing or hemoptysis.  CARDIOVASCULAR:  + chest pain, orthopnea, edema.  GASTROINTESTINAL: No nausea, vomiting, diarrhea or abdominal pain.  GENITOURINARY: No dysuria, hematuria.  ENDOCRINE: No polyuria, nocturia,  HEMATOLOGY: No anemia, easy bruising or bleeding SKIN: No rash or lesion. MUSCULOSKELETAL: No joint pain or arthritis.   NEUROLOGIC: No tingling, numbness, weakness.  PSYCHIATRY: Positive anxiety or depression.   MEDICATIONS AT HOME:  Prior to Admission medications   Medication Sig Start Date End Date Taking? Authorizing Provider  clonazePAM (KLONOPIN) 0.5 MG tablet Take 0.25 mg by mouth 2 (two) times daily.   Yes [provider]  divalproex (DEPAKOTE ER) 250 MG 24 hr tablet Take 250 mg by mouth daily.   Yes [provider]  esomeprazole (NEXIUM) 20 MG capsule Take 20  mg by mouth daily.   Yes [provider]  olmesartan (BENICAR) 40 MG tablet Take 40 mg by mouth daily.   Yes [provider]  ranitidine (ZANTAC) 300 MG tablet Take 300 mg by mouth daily.   Yes [provider]      PHYSICAL EXAMINATION:   VITAL SIGNS: Blood pressure (!) 143/63, pulse (!) 112, temperature 97.8 F (36.6 C), temperature source Oral, resp. rate 20, height 5\' 3"  (1.6 m), weight 193 lb (87.5 kg), SpO2 99 %.  GENERAL:  56 y.o.-year-old patient lying in the bed with no acute distress.  EYES: Pupils equal,  round, reactive to light and accommodation. No scleral icterus. Extraocular muscles intact.  HEENT: Head atraumatic, normocephalic. Oropharynx and nasopharynx clear.  NECK:  Supple, no jugular venous distention. No thyroid enlargement, no tenderness.  LUNGS: Normal breath sounds bilaterally, no wheezing, rales,rhonchi or crepitation. No use of accessory muscles of respiration.  CARDIOVASCULAR: S1, S2 normal. No murmurs, rubs, or gallops.  ABDOMEN: Soft, nontender, nondistended. Bowel sounds present. No organomegaly or mass.  EXTREMITIES: No pedal edema, cyanosis, or clubbing.  NEUROLOGIC: Cranial nerves II through XII are intact. Muscle strength 5/5 in all extremities. Sensation intact. Gait not checked.  PSYCHIATRIC: The patient is alert and oriented x 3. Appears anxious SKIN: No obvious rash, lesion, or ulcer.   LABORATORY PANEL:   CBC  Recent Labs Lab 07/16/17 0827  WBC 11.4*  HGB 14.9  HCT 43.4  PLT 309  MCV 85.3  MCH 29.3  MCHC 34.3  RDW 13.3   ------------------------------------------------------------------------------------------------------------------  Chemistries   Recent Labs Lab 07/16/17 0827  NA 141  K 4.5  CL 107  CO2 25  GLUCOSE 113*  BUN 14  CREATININE 1.01*  CALCIUM 9.5  AST 61*  ALT 54  ALKPHOS 122  BILITOT 1.1   ------------------------------------------------------------------------------------------------------------------ estimated creatinine clearance is 63.6 mL/min (A) (by C-G formula based on SCr of 1.01 mg/dL (H)). ------------------------------------------------------------------------------------------------------------------ No results for input(s): TSH, T4TOTAL, T3FREE, THYROIDAB in the last 72 hours.  Invalid input(s): FREET3   Coagulation profile No results for input(s): INR, PROTIME in the last 168 hours. ------------------------------------------------------------------------------------------------------------------- No  results for input(s): DDIMER in the last 72 hours. -------------------------------------------------------------------------------------------------------------------  Cardiac Enzymes  Recent Labs Lab 07/16/17 0827  TROPONINI 2.87*   ------------------------------------------------------------------------------------------------------------------ Invalid input(s): POCBNP  ---------------------------------------------------------------------------------------------------------------  Urinalysis    Component Value Date/Time   COLORURINE YELLOW (A) 07/16/2017 0831   APPEARANCEUR CLEAR (A) 07/16/2017 0831   LABSPEC 1.008 07/16/2017 0831   PHURINE 7.0 07/16/2017 0831   GLUCOSEU NEGATIVE 07/16/2017 0831   HGBUR SMALL (A) 07/16/2017 0831   BILIRUBINUR NEGATIVE 07/16/2017 0831   KETONESUR NEGATIVE 07/16/2017 0831   PROTEINUR NEGATIVE 07/16/2017 0831   NITRITE NEGATIVE 07/16/2017 0831   LEUKOCYTESUR LARGE (A) 07/16/2017 0831     RADIOLOGY: Dg Chest 2 View  Result Date: 07/16/2017 CLINICAL DATA:  Midsternal chest pain for several days EXAM: CHEST  2 VIEW COMPARISON:  None. FINDINGS: The heart size and mediastinal contours are within normal limits. Both lungs are clear. The visualized skeletal structures are unremarkable. IMPRESSION: No active cardiopulmonary disease. Electronically Signed   By: Inez Catalina M.D.   On: 07/16/2017 08:32    EKG: Orders placed or performed during the hospital encounter of 07/16/17  . ED EKG  . ED EKG    IMPRESSION AND PLAN: Patient is a 56 year old white female with chest pain   1. NSTEMI : Admit to telemetry I have discussed the case with Dr. Towanda Malkin  of cardiology who will assess the patient Start heparin drip Start patient on metoprolol twice a day Start her on high-dose Lipitor Lipid panel in the morning Trend cardiac enzymes Nitroglycerin for pain  2. Essential hypertension we will hold her ACE inhibitor started on metoprolol  3.  Generalized anxiety disorder continue clonazepam I will add Ativan IV as needed  4. GERD patient taking both PPI and H2 blockers which I will continue due to chornic sx  5.UTI present on admission will treat with IV Cipro  6. Misc: heparin for dvt proh    All the records are reviewed and case discussed with ED provider. Management plans discussed with the patient, family and they are in agreement.  CODE STATUS: Code Status History    This patient does not have a recorded code status. Please follow your organizational policy for patients in this situation.       TOTAL TIME TAKING CARE OF THIS PATIENT: 55 minutes.    Dustin Flock M.D on 07/16/2017 at 9:58 AM  Between 7am to 6pm - Pager - [phone]  After 6pm go to [email or web address] - password [email or web address] [facility]  Office  [phone]  CC: Primary care physician; Patient, No Pcp Per

## 2017-07-17 ENCOUNTER — Inpatient Hospital Stay: Payer: Medicare Other

## 2017-07-17 ENCOUNTER — Encounter
Admission: EM | Disposition: A | Discharge status: Home / Self Care | Payer: Self-pay | Source: Home / Self Care | Attending: Internal Medicine

## 2017-07-17 HISTORY — PX: LEFT HEART CATH AND CORONARY ANGIOGRAPHY: CATH118249

## 2017-07-17 HISTORY — PX: CORONARY STENT INTERVENTION: CATH118234

## 2017-07-17 LAB — BASIC METABOLIC PANEL
ANION GAP: 8 (ref 5–15)
BUN: 15 mg/dL (ref 6–20)
CO2: 23 mmol/L (ref 22–32)
CREATININE: 0.95 mg/dL (ref 0.44–1.00)
Calcium: 8.8 mg/dL — ABNORMAL LOW (ref 8.9–10.3)
Chloride: 111 mmol/L (ref 101–111)
GLUCOSE: 100 mg/dL — AB (ref 65–99)
Potassium: 3.9 mmol/L (ref 3.5–5.1)
Sodium: 142 mmol/L (ref 135–145)

## 2017-07-17 LAB — POCT ACTIVATED CLOTTING TIME: ACTIVATED CLOTTING TIME: 411 s

## 2017-07-17 LAB — CBC
HCT: 38.6 % (ref 35.0–47.0)
Hemoglobin: 13.4 g/dL (ref 12.0–16.0)
MCH: 29.7 pg (ref 26.0–34.0)
MCHC: 34.8 g/dL (ref 32.0–36.0)
MCV: 85.4 fL (ref 80.0–100.0)
PLATELETS: 296 10*3/uL (ref 150–440)
RBC: 4.52 MIL/uL (ref 3.80–5.20)
RDW: 12.9 % (ref 11.5–14.5)
WBC: 7.3 10*3/uL (ref 3.6–11.0)

## 2017-07-17 LAB — CARDIAC CATHETERIZATION: CATHEFQUANT: 50 %

## 2017-07-17 LAB — LIPID PANEL
Cholesterol: 230 mg/dL — ABNORMAL HIGH (ref 0–200)
HDL: 41 mg/dL (ref >40)
LDL CALC: 160 mg/dL — AB (ref 0–99)
TRIGLYCERIDES: 145 mg/dL (ref <150)
Total CHOL/HDL Ratio: 5.6 RATIO
VLDL: 29 mg/dL (ref 0–40)

## 2017-07-17 LAB — TROPONIN I: TROPONIN I: 1.71 ng/mL — AB (ref <0.03)

## 2017-07-17 LAB — HEPARIN LEVEL (UNFRACTIONATED): HEPARIN UNFRACTIONATED: 0.59 [IU]/mL (ref 0.30–0.70)

## 2017-07-17 LAB — HIV ANTIBODY (ROUTINE TESTING W REFLEX): HIV SCREEN 4TH GENERATION: NONREACTIVE

## 2017-07-17 SURGERY — LEFT HEART CATH AND CORONARY ANGIOGRAPHY
Anesthesia: Moderate Sedation

## 2017-07-17 MED ORDER — SODIUM CHLORIDE 0.9 % WEIGHT BASED INFUSION
3.0000 mL/kg/h | INTRAVENOUS | Status: DC
  Administered 2017-07-17: 3 mL/kg/h via INTRAVENOUS

## 2017-07-17 MED ORDER — NITROGLYCERIN 5 MG/ML IV SOLN
INTRAVENOUS | Status: AC
  Filled 2017-07-17: qty 10

## 2017-07-17 MED ORDER — MIDAZOLAM HCL 2 MG/2ML IJ SOLN
INTRAMUSCULAR | Status: AC
  Filled 2017-07-17: qty 2

## 2017-07-17 MED ORDER — ASPIRIN 81 MG PO CHEW
81.0000 mg | CHEWABLE_TABLET | Freq: Every day | ORAL | Status: DC
  Administered 2017-07-18: 81 mg via ORAL
  Filled 2017-07-17: qty 1

## 2017-07-17 MED ORDER — ASPIRIN 81 MG PO CHEW: 81.0000 mg | CHEWABLE_TABLET | Freq: Every day | ORAL | Status: DC

## 2017-07-17 MED ORDER — BIVALIRUDIN BOLUS VIA INFUSION - CUPID
INTRAVENOUS | Status: DC | PRN
  Administered 2017-07-17: 64.65 mg via INTRAVENOUS

## 2017-07-17 MED ORDER — FENTANYL CITRATE (PF) 100 MCG/2ML IJ SOLN
INTRAMUSCULAR | Status: DC | PRN
  Administered 2017-07-17 (×3): 50 ug via INTRAVENOUS

## 2017-07-17 MED ORDER — SODIUM CHLORIDE 0.9 % IV SOLN
0.2500 mg/kg/h | INTRAVENOUS | Status: AC
  Filled 2017-07-17: qty 250

## 2017-07-17 MED ORDER — SODIUM CHLORIDE 0.9 % IV SOLN: 250.0000 mL | INTRAVENOUS | Status: DC | PRN

## 2017-07-17 MED ORDER — ACETAMINOPHEN 325 MG PO TABS
650.0000 mg | ORAL_TABLET | ORAL | Status: DC | PRN
  Administered 2017-07-17: 650 mg via ORAL
  Filled 2017-07-17: qty 2

## 2017-07-17 MED ORDER — IOPAMIDOL (ISOVUE-300) INJECTION 61%
INTRAVENOUS | Status: DC | PRN
  Administered 2017-07-17: 400 mL via INTRA_ARTERIAL

## 2017-07-17 MED ORDER — TICAGRELOR 90 MG PO TABS
90.0000 mg | ORAL_TABLET | Freq: Two times a day (BID) | ORAL | Status: DC
  Administered 2017-07-17 – 2017-07-18 (×2): 90 mg via ORAL
  Filled 2017-07-17 (×2): qty 1

## 2017-07-17 MED ORDER — SODIUM CHLORIDE 0.9 % WEIGHT BASED INFUSION: 1.0000 mL/kg/h | INTRAVENOUS | Status: DC

## 2017-07-17 MED ORDER — SODIUM CHLORIDE 0.9% FLUSH: 3.0000 mL | Freq: Two times a day (BID) | INTRAVENOUS | Status: DC

## 2017-07-17 MED ORDER — HEPARIN (PORCINE) IN NACL 2-0.9 UNIT/ML-% IJ SOLN
INTRAMUSCULAR | Status: AC
  Filled 2017-07-17: qty 500

## 2017-07-17 MED ORDER — ASPIRIN 81 MG PO CHEW
CHEWABLE_TABLET | ORAL | Status: AC
  Filled 2017-07-17: qty 3

## 2017-07-17 MED ORDER — FENTANYL CITRATE (PF) 100 MCG/2ML IJ SOLN
INTRAMUSCULAR | Status: AC
  Filled 2017-07-17: qty 2

## 2017-07-17 MED ORDER — ASPIRIN 81 MG PO CHEW
CHEWABLE_TABLET | ORAL | Status: DC | PRN
  Administered 2017-07-17: 324 mg via ORAL

## 2017-07-17 MED ORDER — SODIUM CHLORIDE 0.9% FLUSH
3.0000 mL | Freq: Two times a day (BID) | INTRAVENOUS | Status: DC
  Administered 2017-07-18: 3 mL via INTRAVENOUS

## 2017-07-17 MED ORDER — ONDANSETRON HCL 4 MG/2ML IJ SOLN: 4.0000 mg | Freq: Four times a day (QID) | INTRAMUSCULAR | Status: DC | PRN

## 2017-07-17 MED ORDER — BIVALIRUDIN TRIFLUOROACETATE 250 MG IV SOLR
INTRAVENOUS | Status: AC
  Filled 2017-07-17: qty 250

## 2017-07-17 MED ORDER — SODIUM CHLORIDE 0.9% FLUSH: 3.0000 mL | INTRAVENOUS | Status: DC | PRN

## 2017-07-17 MED ORDER — NITROGLYCERIN 1 MG/10 ML FOR IR/CATH LAB
INTRA_ARTERIAL | Status: DC | PRN
  Administered 2017-07-17: 200 ug via INTRACORONARY

## 2017-07-17 MED ORDER — ASPIRIN 81 MG PO CHEW
81.0000 mg | CHEWABLE_TABLET | Freq: Once | ORAL | Status: AC
  Administered 2017-07-17: 81 mg via ORAL

## 2017-07-17 MED ORDER — LABETALOL HCL 5 MG/ML IV SOLN: 10.0000 mg | INTRAVENOUS | Status: AC | PRN

## 2017-07-17 MED ORDER — ASPIRIN 81 MG PO CHEW: 81.0000 mg | CHEWABLE_TABLET | ORAL | Status: DC

## 2017-07-17 MED ORDER — TICAGRELOR 90 MG PO TABS
ORAL_TABLET | ORAL | Status: DC | PRN
  Administered 2017-07-17: 180 mg via ORAL

## 2017-07-17 MED ORDER — SODIUM CHLORIDE 0.9 % IV SOLN
INTRAVENOUS | Status: AC | PRN
  Administered 2017-07-17 (×2): 1.75 mg/kg/h via INTRAVENOUS

## 2017-07-17 MED ORDER — HYDRALAZINE HCL 20 MG/ML IJ SOLN: 5.0000 mg | INTRAMUSCULAR | Status: AC | PRN

## 2017-07-17 MED ORDER — SODIUM CHLORIDE 0.9 % WEIGHT BASED INFUSION: 1.0000 mL/kg/h | INTRAVENOUS | Status: AC

## 2017-07-17 MED ORDER — ALPRAZOLAM 0.5 MG PO TABS
1.0000 mg | ORAL_TABLET | Freq: Two times a day (BID) | ORAL | Status: DC | PRN
  Administered 2017-07-17: 1 mg via ORAL
  Filled 2017-07-17: qty 2

## 2017-07-17 MED ORDER — MIDAZOLAM HCL 2 MG/2ML IJ SOLN
INTRAMUSCULAR | Status: DC | PRN
  Administered 2017-07-17 (×3): 1 mg via INTRAVENOUS

## 2017-07-17 MED ORDER — ASPIRIN 81 MG PO CHEW
CHEWABLE_TABLET | ORAL | Status: AC
Start: 2017-07-17 — End: 2017-07-17
  Filled 2017-07-17: qty 1

## 2017-07-17 MED ORDER — TICAGRELOR 90 MG PO TABS
ORAL_TABLET | ORAL | Status: AC
  Filled 2017-07-17: qty 2

## 2017-07-17 SURGICAL SUPPLY — 18 items
BALLN TREK RX 2.5X20 (BALLOONS) ×2
BALLN ~~LOC~~ TREK RX 2.75X15 (BALLOONS) ×2
BALLOON TREK RX 2.5X20 (BALLOONS) ×1 IMPLANT
BALLOON ~~LOC~~ TREK RX 2.75X15 (BALLOONS) ×1 IMPLANT
CATH 5FR JL4 DIAGNOSTIC (CATHETERS) ×2 IMPLANT
CATH 5FR JR4 DIAGNOSTIC (CATHETERS) ×2 IMPLANT
CATH INFINITI 5FR ANG PIGTAIL (CATHETERS) ×2 IMPLANT
CATH VISTA GUIDE 6FR XB3.5 SH (CATHETERS) ×2 IMPLANT
DEVICE CLOSURE MYNXGRIP 6/7F (Vascular Products) ×2 IMPLANT
DEVICE INFLAT 30 PLUS (MISCELLANEOUS) ×2 IMPLANT
KIT MANI 3VAL PERCEP (MISCELLANEOUS) ×2 IMPLANT
NEEDLE PERC 18GX7CM (NEEDLE) ×2 IMPLANT
PACK CARDIAC CATH (CUSTOM PROCEDURE TRAY) ×2 IMPLANT
SHEATH AVANTI 5FR X 11CM (SHEATH) ×2 IMPLANT
SHEATH AVANTI 6FR X 11CM (SHEATH) ×2 IMPLANT
STENT XIENCE ALPINE RX 2.5X23 (Permanent Stent) ×2 IMPLANT
WIRE EMERALD 3MM-J .035X150CM (WIRE) ×2 IMPLANT
WIRE G HI TQ BMW 190 (WIRE) ×2 IMPLANT

## 2017-07-17 NOTE — Plan of Care (Signed)
Problem: Education: Goal: Knowledge of [facility] Education information/materials will improve Outcome: Progressing Patient admitted for intermittent  chest pain. The plan of care includes continuous cardiopulmonary monitoring, NPO for cardiac cath,  labs, heparin drip, pain and anxiety management and all other health conditions will be monitored while in the hospital.  Problem: Tissue Perfusion: Goal: Risk factors for ineffective tissue perfusion will decrease Outcome: Progressing Patient to continue on heparin drip for cath procedure. Patient will remain chest pain free during the shift

## 2017-07-17 NOTE — Progress Notes (Signed)
ANTICOAGULATION CONSULT NOTE - Initial Consult  Pharmacy Consult for heparin Indication: chest pain/ACS  Allergies  Allergen Reactions  . Other Anaphylaxis    Ex lax  . Penicillins Rash    .Has patient had a PCN reaction causing immediate rash, facial/tongue/throat swelling, SOB or lightheadedness with hypotension: No Has patient had a PCN reaction causing severe rash involving mucus membranes or skin necrosis: No Has patient had a PCN reaction that required hospitalization: No Has patient had a PCN reaction occurring within the last 10 years: No If all of the above answers are "NO", then may proceed with Cephalosporin use.     Patient Measurements: Height: 5\' 3"  (160 cm) Weight: 190 lb 12.8 oz (86.5 kg) IBW/kg (Calculated) : 52.4 Heparin Dosing Weight: 72.1 kg  Vital Signs: Temp: 98.3 F (36.8 C) (07/19 0513) Temp Source: Oral (07/18 2059) BP: 126/71 (07/19 0513) Pulse Rate: 67 (07/19 0513)  Labs:  Recent Labs  07/16/17 0827  07/16/17 1047 07/16/17 1132 07/16/17 1713 07/16/17 2319 07/17/17 0533  HGB 14.9  --   --   --   --   --  13.4  HCT 43.4  --   --   --   --   --  38.6  PLT 309  --   --   --   --   --  296  APTT  --   --  28  --   --   --   --   LABPROT  --   --  13.1  --   --   --   --   INR  --   --  0.99  --   --   --   --   HEPARINUNFRC  --   --   --   --  0.50 0.38 0.59  CREATININE 1.01*  --   --   --   --   --   --   TROPONINI 2.87*  < >  --  2.99* 2.20* 1.71*  --   < > = values in this interval not displayed.  Estimated Creatinine Clearance: 63.3 mL/min (A) (by C-G formula based on SCr of 1.01 mg/dL (H)).   Medical History: Past Medical History:  Diagnosis Date  . Bipolar affective disorder ([place])   . GERD (gastroesophageal reflux disease)   . Hypertension   . Panic attack     Medications:  Infusions:  . sodium chloride 75 mL/hr at 07/16/17 2245  . ciprofloxacin Stopped (07/16/17 2345)  . heparin 1,000 Units/hr (07/17/17 3875)     Assessment: 59 yof cc abdominal pain (presenting with GERD like reflux symptoms: burning x 2 days, aching substernal pain radiating to back). PMH bipolar affective disorder,ax, GERD. Troponin in ED 2.87 x 1. Pharmacy consulted to dose UFH for ACS. No OAC on PTA list.   Goal of Therapy:  Heparin level 0.3-0.7 units/ml Monitor platelets by anticoagulation protocol: Yes   Plan:  Continue heparin infusion at current rate and recheck a HL in 6 hours.   7/19 @ 0533 HL 0.59 therapeutic. Will continue current rate and will recheck HL w/ am labs.  Tobie Lords, Pharm.D., BCPS Clinical Pharmacist 07/17/2017,7:08 AM

## 2017-07-17 NOTE — Progress Notes (Signed)
Patient had episode of chest pain after getting back to bed from bathroom. This episode is the same pain that brought her in. Patient unable to describe the pain, she states that the pain gets more intense when she lays down but gradually subsides when she sits up. Episode lasts approximately 10 minutes. Patient was pain free upon this RN's assessment. VSS. Patient did not appear to be in any distress. Patient very tearful about situation. Emotional support give. Will continue to monitor.

## 2017-07-17 NOTE — Progress Notes (Signed)
Chaplain visited with patient. Provided spiritual presence and words of encouragement.     07/17/17 1725  Clinical Encounter Type  Visited With Patient  Visit Type Initial;Spiritual support;Post-op  Referral From Nurse  Consult/Referral To Chaplain  Spiritual Encounters  Spiritual Needs Other (Comment)

## 2017-07-17 NOTE — Progress Notes (Signed)
[place] [place] at [facility]                                                                                                                                                                                  Patient Demographics   Amber Mills, is a 56 y.o. female, DOB - 02-23-1961, NAT:[id number]  Admit date - 07/16/2017   Admitting Physician Dustin Flock, MD  Outpatient Primary MD for the patient is Patient, No Pcp Per   LOS - 1  Subjective: Patient states that she had episode of chest pain was moderate but currently awaiting cardiac catheterization  Patient also endorses corectopia quadrant pain with eating   Review of Systems:   CONSTITUTIONAL: No documented fever. No fatigue, weakness. No weight gain, no weight loss.  EYES: No blurry or double vision.  ENT: No tinnitus. No postnasal drip. No redness of the oropharynx.  RESPIRATORY: No cough, no wheeze, no hemoptysis. No dyspnea.  CARDIOVASCULAR: No chest pain. No orthopnea. No palpitations. No syncope.  GASTROINTESTINAL: No nausea, no vomiting or diarrhea. No abdominal pain. No melena or hematochezia.  GENITOURINARY: No dysuria or hematuria.  ENDOCRINE: No polyuria or nocturia. No heat or cold intolerance.  HEMATOLOGY: No anemia. No bruising. No bleeding.  INTEGUMENTARY: No rashes. No lesions.  MUSCULOSKELETAL: No arthritis. No swelling. No gout.  NEUROLOGIC: No numbness, tingling, or ataxia. No seizure-type activity.  PSYCHIATRIC: No anxiety. No insomnia. No ADD.    Vitals:   Vitals:   07/17/17 0513 07/17/17 0855 07/17/17 1210 07/17/17 1237  BP: 126/71 113/65 138/68 (!) 159/71  Pulse: 67 73 86 (!) 106  Resp: 18 16 16 14   Temp: 98.3 F (36.8 C) 98.3 F (36.8 C) 98.3 F (36.8 C) 98.4 F (36.9 C)  TempSrc:  Oral Oral Oral  SpO2: 97% 96% 98% 100%  Weight:    190 lb (86.2 kg)  Height:    5\' 3"  (1.6 m)    Wt Readings from Last 3 Encounters:  07/17/17 190 lb (86.2 kg)     Intake/Output  Summary (Last 24 hours) at 07/17/17 1336 Last data filed at 07/17/17 1254  Gross per 24 hour  Intake           727.83 ml  Output              600 ml  Net           127.83 ml    Physical Exam:   GENERAL: Pleasant-appearing in no apparent distress.  HEAD, EYES, EARS, NOSE AND THROAT: Atraumatic, normocephalic. Extraocular muscles are intact. Pupils equal and reactive to light. Sclerae anicteric. No conjunctival injection. No oro-pharyngeal erythema.  NECK: Supple.  There is no jugular venous distention. No bruits, no lymphadenopathy, no thyromegaly.  HEART: Regular rate and rhythm,. No murmurs, no rubs, no clicks.  LUNGS: Clear to auscultation bilaterally. No rales or rhonchi. No wheezes.  ABDOMEN: Soft, flat, nontender, nondistended. Has good bowel sounds. No hepatosplenomegaly appreciated.  EXTREMITIES: No evidence of any cyanosis, clubbing, or peripheral edema.  +2 pedal and radial pulses bilaterally.  NEUROLOGIC: The patient is alert, awake, and oriented x3 with no focal motor or sensory deficits appreciated bilaterally.  SKIN: Moist and warm with no rashes appreciated.  Psych: Not anxious, depressed LN: No inguinal LN enlargement    Antibiotics   Anti-infectives    Start     Dose/Rate Route Frequency Ordered Stop   07/16/17 2200  [MAR Hold]  ciprofloxacin (CIPRO) IVPB 400 mg     (MAR Hold since 07/17/17 1235)   400 mg 200 mL/hr over 60 Minutes Intravenous Every 12 hours 07/16/17 1441     07/16/17 1000  ciprofloxacin (CIPRO) IVPB 400 mg  Status:  Discontinued     400 mg 200 mL/hr over 60 Minutes Intravenous Every 12 hours 07/16/17 0955 07/16/17 0955   07/16/17 0915  ciprofloxacin (CIPRO) IVPB 400 mg     400 mg 200 mL/hr over 60 Minutes Intravenous  Once 07/16/17 0911 07/16/17 1050      Medications   Scheduled Meds: . [MAR Hold] aspirin EC  325 mg Oral Daily  . [MAR Hold] atorvastatin  80 mg Oral q1800  . [MAR Hold] clonazePAM  0.25 mg Oral BID  . [MAR Hold] divalproex   250 mg Oral Daily  . [MAR Hold] famotidine  20 mg Oral BID  . [MAR Hold] metoprolol tartrate  25 mg Oral BID  . [MAR Hold] pantoprazole  40 mg Oral BID  . sodium chloride flush  3 mL Intravenous Q12H   Continuous Infusions: . sodium chloride 75 mL/hr at 07/16/17 2245  . sodium chloride    . [START ON 07/18/2017] sodium chloride 3 mL/kg/hr (07/17/17 1254)   Followed by  . [START ON 07/18/2017] sodium chloride    . [MAR Hold] ciprofloxacin Stopped (07/16/17 2345)  . heparin 1,000 Units/hr (07/17/17 1540)   PRN Meds:.sodium chloride, [MAR Hold] acetaminophen **OR** [MAR Hold] acetaminophen, fentaNYL, [MAR Hold] HYDROcodone-acetaminophen, [MAR Hold] LORazepam, midazolam, [MAR Hold] ondansetron **OR** [MAR Hold] ondansetron (ZOFRAN) IV, sodium chloride flush   Data Review:   Micro Results Recent Results (from the past 240 hour(s))  Urine culture     Status: Abnormal (Preliminary result)   Collection Time: 07/16/17  8:31 AM  Result Value Ref Range Status   Specimen Description URINE, CLEAN CATCH  Final   Special Requests NONE  Final   Culture >=100,000 COLONIES/mL GRAM NEGATIVE RODS (A)  Final   Report Status PENDING  Incomplete    Radiology Reports Dg Chest 2 View  Result Date: 07/16/2017 CLINICAL DATA:  Midsternal chest pain for several days EXAM: CHEST  2 VIEW COMPARISON:  None. FINDINGS: The heart size and mediastinal contours are within normal limits. Both lungs are clear. The visualized skeletal structures are unremarkable. IMPRESSION: No active cardiopulmonary disease. Electronically Signed   By: Inez Catalina M.D.   On: 07/16/2017 08:32   US Abdomen Complete  Result Date: 07/17/2017 CLINICAL DATA:  Abdominal pain. EXAM: ABDOMEN ULTRASOUND COMPLETE COMPARISON:  Ultrasound 02/19/2011. FINDINGS: Gallbladder: Small echogenic foci measuring up 3 mm consistent with adenomyomatosis. Tiny nonshadowing gallstones cannot be excluded. Common bile duct: Diameter: 4.3 mm Liver: No focal  lesion identified. Increase echogenicity consistent fatty infiltration and/or hepatocellular disease. IVC: No abnormality visualized. Pancreas: Visualized portion unremarkable. Spleen: Size and appearance within normal limits. Right Kidney: Length: 10.3 cm. Echogenicity within normal limits. No mass or hydronephrosis visualized. Left Kidney: Length: 11.2 cm. Echogenicity within normal limits. No mass or hydronephrosis visualized. 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 Abdominal aorta: No aneurysm visualized. Other findings: None. IMPRESSION: 1. 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 2. Tiny nonshadowing echo densities in the gallbladder consist with adenomyomatosis. Tiny nonshadowing gallstones may be present. No evidence of cholecystitis or biliary distention. 3. Increased echogenicity liver consistent fatty infiltration and/or hepatocellular disease. 4. 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 Electronically Signed   By: Marcello Moores  Register   On: 07/17/2017 12:41     CBC  Recent Labs Lab 07/16/17 0827 07/17/17 0533  WBC 11.4* 7.3  HGB 14.9 13.4  HCT 43.4 38.6  PLT 309 296  MCV 85.3 85.4  MCH 29.3 29.7  MCHC 34.3 34.8  RDW 13.3 12.9    Chemistries   Recent Labs Lab 07/16/17 0827 07/17/17 0533  NA 141 142  K 4.5 3.9  CL 107 111  CO2 25 23  GLUCOSE 113* 100*  BUN 14 15  CREATININE 1.01* 0.95  CALCIUM 9.5 8.8*  AST 61*  --   ALT 54  --   ALKPHOS 122  --   BILITOT 1.1  --    ------------------------------------------------------------------------------------------------------------------ estimated creatinine clearance is 67.2 mL/min (by C-G formula based on SCr of 0.95  mg/dL). ------------------------------------------------------------------------------------------------------------------ No results for input(s): HGBA1C in the last 72 hours. ------------------------------------------------------------------------------------------------------------------  Recent Labs  07/17/17 0533  CHOL 230*  HDL 41  LDLCALC 160*  TRIG 145  CHOLHDL 5.6   ------------------------------------------------------------------------------------------------------------------  Recent Labs  07/16/17 1132  TSH 2.296   ------------------------------------------------------------------------------------------------------------------ No results for input(s): VITAMINB12, FOLATE, FERRITIN, TIBC, IRON, RETICCTPCT in the last 72 hours.  Coagulation profile  Recent Labs Lab 07/16/17 1047  INR 0.99    No results for input(s): DDIMER in the last 72 hours.  Cardiac Enzymes  Recent Labs Lab 07/16/17 1132 07/16/17 1713 07/16/17 2319  TROPONINI 2.99* 2.20* 1.71*   ------------------------------------------------------------------------------------------------------------------ Invalid input(s): POCBNP    Assessment & Plan   IMPRESSION AND PLAN: Patient is a 56 year old white female with chest pain   1. NSTEMI : Admit to telemetry Cardiac catheter today Continue metoprolol twice a day Continue high-dose Lipitor Nitroglycerin for pain  2. Essential hypertension  continue Toprol  3. Generalized anxiety disorder continue clonazepam I will add Ativan IV as needed  4. GERD continue PPI and H2 blockers  5.UTI present on admission changed to oral Cipro  6. Right upper quadrant abdominal pain will obtain ultrasound of the abdomen in the morning to rule out gallbladder causes     Code Status Orders        Start     Ordered   07/16/17 1125  Full code  Continuous     07/16/17 1124    Code Status History    Date Active Date Inactive Code Status  Order ID Comments User Context   This patient has a current code status but no historical code status.           Consults Cardiology DVT Prophylaxis  heparin  Lab Results  Component Value Date   PLT 296 07/17/2017     Time Spent in minutes 23min  Greater than 50% of time spent in care coordination and counseling patient regarding the condition and plan of care.   Dustin Flock M.D on 07/17/2017 at 1:36 PM  Between 7am to 6pm - Pager - [phone]  After 6pm go to [email or web address] - password [email or web address] [facility]   Office  [phone]

## 2017-07-18 ENCOUNTER — Encounter: Payer: Self-pay | Admitting: Internal Medicine

## 2017-07-18 LAB — URINE CULTURE: Culture: >=100000 — AB

## 2017-07-18 MED ORDER — TICAGRELOR 90 MG PO TABS: 90.0000 mg | ORAL_TABLET | Freq: Two times a day (BID) | ORAL | 2 refills | Status: DC

## 2017-07-18 MED ORDER — CARVEDILOL 3.125 MG PO TABS: 3.1250 mg | ORAL_TABLET | Freq: Two times a day (BID) | ORAL | 11 refills | Status: DC

## 2017-07-18 MED ORDER — PANTOPRAZOLE SODIUM 40 MG PO TBEC: 40.0000 mg | DELAYED_RELEASE_TABLET | Freq: Two times a day (BID) | ORAL | 0 refills | Status: DC

## 2017-07-18 MED ORDER — CIPROFLOXACIN HCL 500 MG PO TABS: 500.0000 mg | ORAL_TABLET | Freq: Two times a day (BID) | ORAL | 0 refills | Status: AC

## 2017-07-18 MED ORDER — OLMESARTAN MEDOXOMIL 20 MG PO TABS: 20.0000 mg | ORAL_TABLET | Freq: Every day | ORAL | Status: DC

## 2017-07-18 MED ORDER — ATORVASTATIN CALCIUM 80 MG PO TABS: 80.0000 mg | ORAL_TABLET | Freq: Every day | ORAL | 0 refills | Status: DC

## 2017-07-18 NOTE — Progress Notes (Signed)
Patient ambulated unit. Pt denies pain to right groin and SOB.

## 2017-07-18 NOTE — Discharge Summary (Signed)
MI / NSTEMI Discharge Checklist  Aspirin prescribed at discharge:   yes High Intensity Statin Prescribed? yes  Beta Blocker Prescribed: yes  ADP Receptor Inhibitor Prescribed? yes  Was EF assessed during THIS hospitalization? yes More than 40%  Was Cardiac Rehab Phase II ordered prior to discharge?  yes

## 2017-07-18 NOTE — Discharge Summary (Signed)
[facility] [place] [facility], 56 y.o., DOB 04-13-1961, MRN [id number]. Admission date: 07/16/2017 Discharge Date 07/18/2017 Primary MD Patient, No Pcp Per Admitting Physician Dustin Flock, MD  Admission Diagnosis  Anxiety [F41.9] NSTEMI (non-ST elevated myocardial infarction) [facility]) [I21.4] Acute cystitis without hematuria [N30.00]  Discharge Diagnosis   Active Problems:   NSTEMI (non-ST elevated myocardial infarction) [facility])  uti  hyperlipdemia  anxiety  essential hypertention        Hospital Course  Matisse Roskelley  is a 56 y.o. female with a known history of Bipolar affective disorder, anxiety, GERD who has had chronic issues with GERD with reflux like symptoms and burning in the abdomen who 2 days ago while going to the bathroom at night developed aching substernal pain going to her back. After a while that subsided. Patient didn't have any further symptoms and last night again started having the same symptoms with the chest pain radiating to her back. Pt in ed noted to have elevated trop no ekg changes. She was admited and underwent CATH. Cath results showed   Mildly reduced LVF EF=50% Ant/Apical hypo  Cors Normal LMain LAD proximal 99% lesion Circ Large mixed dominant OK RCA medium OK   Pt has stent placed, now no cp.          Consults  cards Significant Tests:  See full reports for all details     Dg Chest 2 View  Result Date: 07/16/2017 CLINICAL DATA:  Midsternal chest pain for several days EXAM: CHEST  2 VIEW COMPARISON:  None. FINDINGS: The heart size and mediastinal contours are within normal limits. Both lungs are clear. The visualized skeletal structures are unremarkable. IMPRESSION: No active cardiopulmonary disease. Electronically Signed   By: Inez Catalina M.D.   On: 07/16/2017 08:32   US Abdomen Complete  Result Date: 07/17/2017 CLINICAL DATA:  Abdominal pain. EXAM: ABDOMEN ULTRASOUND COMPLETE COMPARISON:   Ultrasound 02/19/2011. FINDINGS: Gallbladder: Small echogenic foci measuring up 3 mm consistent with adenomyomatosis. Tiny nonshadowing gallstones cannot be excluded. Common bile duct: Diameter: 4.3 mm Liver: No focal lesion identified. Increase echogenicity consistent fatty infiltration and/or hepatocellular disease. IVC: No abnormality visualized. Pancreas: Visualized portion unremarkable. Spleen: Size and appearance within normal limits. Right Kidney: Length: 10.3 cm. Echogenicity within normal limits. No mass or hydronephrosis visualized. Left Kidney: Length: 11.2 cm. Echogenicity within normal limits. No mass or hydronephrosis visualized. 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 Abdominal aorta: No aneurysm visualized. Other findings: None. IMPRESSION: 1. 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 2. Tiny nonshadowing echo densities in the gallbladder consist with adenomyomatosis. Tiny nonshadowing gallstones may be present. No evidence of cholecystitis or biliary distention. 3. Increased echogenicity liver consistent fatty infiltration and/or hepatocellular disease. 4. 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 Electronically Signed   By: Marcello Moores  Register   On: 07/17/2017 12:41       Today   Subjective:   Jori Moll no cp feels well  Objective:   Blood pressure 136/60, pulse 72, temperature (!) 97.5 F (36.4 C), temperature source Oral, resp. rate 18, height 5\' 3"  (1.6 m), weight 193 lb 9.6 oz (87.8 kg), SpO2 98 %.  .  Intake/Output Summary (Last 24 hours) at 07/18/17 1643 Last data filed at 07/18/17 0925  Gross per 24  hour  Intake           1257.5 ml  Output              950 ml  Net            307.5 ml    Exam VITAL SIGNS: Blood pressure 136/60,  pulse 72, temperature (!) 97.5 F (36.4 C), temperature source Oral, resp. rate 18, height 5\' 3"  (1.6 m), weight 193 lb 9.6 oz (87.8 kg), SpO2 98 %.  GENERAL:  56 y.o.-year-old patient lying in the bed with no acute distress.  EYES: Pupils equal, round, reactive to light and accommodation. No scleral icterus. Extraocular muscles intact.  HEENT: Head atraumatic, normocephalic. Oropharynx and nasopharynx clear.  NECK:  Supple, no jugular venous distention. No thyroid enlargement, no tenderness.  LUNGS: Normal breath sounds bilaterally, no wheezing, rales,rhonchi or crepitation. No use of accessory muscles of respiration.  CARDIOVASCULAR: S1, S2 normal. No murmurs, rubs, or gallops.  ABDOMEN: Soft, nontender, nondistended. Bowel sounds present. No organomegaly or mass.  EXTREMITIES: No pedal edema, cyanosis, or clubbing.  NEUROLOGIC: Cranial nerves II through XII are intact. Muscle strength 5/5 in all extremities. Sensation intact. Gait not checked.  PSYCHIATRIC: The patient is alert and oriented x 3.  SKIN: No obvious rash, lesion, or ulcer.   Data Review     CBC w Diff: Lab Results  Component Value Date   WBC 7.3 07/17/2017   HGB 13.4 07/17/2017   HCT 38.6 07/17/2017   PLT 296 07/17/2017   CMP: Lab Results  Component Value Date   NA 142 07/17/2017   K 3.9 07/17/2017   CL 111 07/17/2017   CO2 23 07/17/2017   BUN 15 07/17/2017   CREATININE 0.95 07/17/2017   PROT 7.9 07/16/2017   ALBUMIN 4.4 07/16/2017   BILITOT 1.1 07/16/2017   ALKPHOS 122 07/16/2017   AST 61 (H) 07/16/2017   ALT 54 07/16/2017  .  Micro Results Recent Results (from the past 240 hour(s))  Urine culture     Status: Abnormal   Collection Time: 07/16/17  8:31 AM  Result Value Ref Range Status   Specimen Description URINE, CLEAN CATCH  Final   Special Requests NONE  Final   Culture >=100,000 COLONIES/mL ESCHERICHIA COLI (A)  Final   Report Status 07/18/2017 FINAL  Final   Organism ID, Bacteria ESCHERICHIA  COLI (A)  Final      Susceptibility   Escherichia coli - MIC*    AMPICILLIN <=2 SENSITIVE Sensitive     CEFAZOLIN <=4 SENSITIVE Sensitive     CEFTRIAXONE <=1 SENSITIVE Sensitive     CIPROFLOXACIN <=0.25 SENSITIVE Sensitive     GENTAMICIN <=1 SENSITIVE Sensitive     IMIPENEM <=0.25 SENSITIVE Sensitive     NITROFURANTOIN <=16 SENSITIVE Sensitive     TRIMETH/SULFA <=20 SENSITIVE Sensitive     AMPICILLIN/SULBACTAM <=2 SENSITIVE Sensitive     PIP/TAZO <=4 SENSITIVE Sensitive     Extended ESBL NEGATIVE Sensitive     * >=100,000 COLONIES/mL ESCHERICHIA COLI     Code Status History    Date Active Date Inactive Code Status Order ID Comments User Context   07/16/2017 11:24 AM 07/18/2017  3:52 PM Full Code [id number]  Dustin Flock, MD Inpatient          Follow-up Information    Yolonda Kida, MD. Go on 07/24/2017.   Specialties:  Cardiology, Internal Medicine Why:  Appointment Time: 11:45am Contact information: [place] [address] [phone]        Sparks, Jeffrey D, MD Follow up in 2 week(s).   Specialty:  Internal Medicine Why:  as new patient; The office will call you with an appointment. Thanks! Contact information: [place] [address] [phone]           Discharge Medications   Allergies as of 07/18/2017      Reactions   Other Anaphylaxis   Ex lax   Zofran [ondansetron Hcl] Itching   Penicillins Rash   .Has patient had a PCN reaction causing immediate rash, facial/tongue/throat swelling, SOB or lightheadedness with hypotension: No Has patient had a PCN reaction causing severe rash involving mucus membranes or skin necrosis: No Has patient had a PCN reaction that required hospitalization: No Has patient had a PCN reaction occurring within the last 10 years: No If all of the above answers are "NO", then may proceed with Cephalosporin use.       Medication List    STOP taking these medications   esomeprazole 20 MG capsule Commonly known as:  [place] by:  pantoprazole 40 MG tablet     TAKE these medications   atorvastatin 80 MG tablet Commonly known as:  LIPITOR Take 1 tablet (80 mg total) by mouth daily at 6 PM.   carvedilol 3.125 MG tablet Commonly known as:  COREG Take 1 tablet (3.125 mg total) by mouth 2 (two) times daily.   ciprofloxacin 500 MG tablet Commonly known as:  CIPRO Take 1 tablet (500 mg total) by mouth 2 (two) times daily.   clonazePAM 0.5 MG tablet Commonly known as:  KLONOPIN Take 0.25 mg by mouth 2 (two) times daily.   divalproex 250 MG 24 hr tablet Commonly known as:  DEPAKOTE ER Take 250 mg by mouth daily.   olmesartan 20 MG tablet Commonly known as:  BENICAR Take 1 tablet (20 mg total) by mouth daily. What changed:  medication strength  how much to take   pantoprazole 40 MG tablet Commonly known as:  PROTONIX Take 1 tablet (40 mg total) by mouth 2 (two) times daily. Replaces:  esomeprazole 20 MG capsule   ranitidine 300 MG tablet Commonly known as:  ZANTAC Take 300 mg by mouth daily.   ticagrelor 90 MG Tabs tablet Commonly known as:  BRILINTA Take 1 tablet (90 mg total) by mouth 2 (two) times daily.          Total Time in preparing paper work, data evaluation and todays exam - 35 minutes  Dustin Flock M.D on 07/18/2017 at 4:43 PM  [facility]   Office  [phone]

## 2017-07-18 NOTE — Care Management (Addendum)
Patient lives with her two 58 year old twins- who at present are being cared for by other family members.  Patient independent in her adls and no issues accessing medical care , obtaining medications or with transportation.  Patient has Consulting civil [employer] and medicaid.  Local pharmacy is [facility] in [place].  PCP is Dr Doy Hutching.  Provided patient with [place] 30 day coupon for medicare/medicaid patients.  Patient does have pharmacy coverage and additional assistance.  Her copays are usually 3 dollars.  Phase II outpatient cardiac reahb referral present.  No other discharge needs identified.

## 2017-07-18 NOTE — Progress Notes (Signed)
Discharge instructions reviewed with patient. Patient verbalized understanding of appointments and prescriptions, IV removed, dressing applied. Patient encourage to ask questions and voice concerns. NAD noted at discharge.

## 2017-07-18 NOTE — Progress Notes (Signed)
Patient returned to unit from the cath lab. Right groin site clean, dry and intact.  Pressure dressing intact. No signs of bleeding or hematoma. +2 right pedal present. Patient instructed to keep right leg straight until 1830. Patient denies pain to right groin. Will continue to monitor.

## 2017-07-18 NOTE — Care Management Important Message (Signed)
Important Message  Patient Details  Name: Amber Mills MRN: [id number] Date of Birth: Mar 23, 1961   Medicare Important Message Given:  Yes  Signed IM notice given    Katrina Stack, RN 07/18/2017, 11:48 AM

## 2017-07-18 NOTE — Discharge Instructions (Addendum)
[place] at [place]:  Cardiac diet  DISCHARGE CONDITION:  Stable  ACTIVITY:  Activity as tolerated  OXYGEN:  Home Oxygen: no   Oxygen Delivery: room air  DISCHARGE LOCATION:  home    ADDITIONAL DISCHARGE INSTRUCTION:   If you experience worsening of your admission symptoms, develop shortness of breath, life threatening emergency, suicidal or homicidal thoughts you must seek medical attention immediately by calling 911 or calling your MD immediately  if symptoms less severe.  You Must read complete instructions/literature along with all the possible adverse reactions/side effects for all the Medicines you take and that have been prescribed to you. Take any new Medicines after you have completely understood and accpet all the possible adverse reactions/side effects.   Please note  You were cared for by a hospitalist during your hospital stay. If you have any questions about your discharge medications or the care you received while you were in the hospital after you are discharged, you can call the unit and asked to speak with the hospitalist on call if the hospitalist that took care of you is not available. Once you are discharged, your primary care physician will handle any further medical issues. Please note that NO REFILLS for any discharge medications will be authorized once you are discharged, as it is imperative that you return to your primary care physician (or establish a relationship with a primary care physician if you do not have one) for your aftercare needs so that they can reassess your need for medications and monitor your lab values.

## 2017-08-01 DIAGNOSIS — Z860100 Personal history of colon polyps, unspecified: Secondary | ICD-10-CM | POA: Insufficient documentation

## 2018-01-30 ENCOUNTER — Other Ambulatory Visit: Payer: Self-pay | Admitting: Internal Medicine

## 2018-01-30 DIAGNOSIS — R197 Diarrhea, unspecified: Secondary | ICD-10-CM

## 2018-01-30 DIAGNOSIS — K219 Gastro-esophageal reflux disease without esophagitis: Secondary | ICD-10-CM

## 2018-01-30 DIAGNOSIS — K8020 Calculus of gallbladder without cholecystitis without obstruction: Secondary | ICD-10-CM

## 2018-02-02 ENCOUNTER — Ambulatory Visit
Admission: RE | Admit: 2018-02-02 | Discharge: 2018-02-02 | Disposition: A | Discharge status: Home / Self Care | Payer: Medicare Other | Source: Ambulatory Visit | Attending: Internal Medicine | Admitting: Internal Medicine

## 2018-02-02 DIAGNOSIS — R1013 Epigastric pain: Secondary | ICD-10-CM | POA: Diagnosis present

## 2018-02-02 DIAGNOSIS — K8020 Calculus of gallbladder without cholecystitis without obstruction: Secondary | ICD-10-CM

## 2018-02-04 ENCOUNTER — Other Ambulatory Visit
Admission: RE | Admit: 2018-02-04 | Discharge: 2018-02-04 | Disposition: A | Discharge status: Home / Self Care | Payer: Medicare Other | Source: Ambulatory Visit | Attending: Internal Medicine | Admitting: Internal Medicine

## 2018-02-04 DIAGNOSIS — R197 Diarrhea, unspecified: Secondary | ICD-10-CM | POA: Insufficient documentation

## 2018-02-04 LAB — GASTROINTESTINAL PANEL BY PCR, STOOL (REPLACES STOOL CULTURE)

## 2018-02-04 LAB — LACTOFERRIN, FECAL, QUALITATIVE: LACTOFERRIN, FECAL, QUAL: NEGATIVE

## 2018-02-04 LAB — C DIFFICILE QUICK SCREEN W PCR REFLEX
C DIFFICILE (CDIFF) INTERP: NOT DETECTED
C Diff antigen: NEGATIVE
C Diff toxin: NEGATIVE

## 2018-02-05 ENCOUNTER — Ambulatory Visit: Payer: Medicare Other

## 2018-08-18 ENCOUNTER — Other Ambulatory Visit: Payer: Self-pay | Admitting: Internal Medicine

## 2018-08-18 DIAGNOSIS — Z1231 Encounter for screening mammogram for malignant neoplasm of breast: Secondary | ICD-10-CM

## 2018-09-10 DIAGNOSIS — Z7901 Long term (current) use of anticoagulants: Secondary | ICD-10-CM | POA: Insufficient documentation

## 2018-10-08 ENCOUNTER — Other Ambulatory Visit: Payer: Self-pay | Admitting: Internal Medicine

## 2018-10-08 DIAGNOSIS — Z1231 Encounter for screening mammogram for malignant neoplasm of breast: Secondary | ICD-10-CM

## 2018-10-29 ENCOUNTER — Ambulatory Visit
Admission: RE | Admit: 2018-10-29 | Discharge: 2018-10-29 | Disposition: A | Discharge status: Home / Self Care | Payer: Medicare Other | Source: Ambulatory Visit | Attending: Internal Medicine | Admitting: Internal Medicine

## 2018-10-29 DIAGNOSIS — Z1231 Encounter for screening mammogram for malignant neoplasm of breast: Secondary | ICD-10-CM

## 2019-01-06 ENCOUNTER — Telehealth: Payer: Self-pay | Admitting: Gastroenterology

## 2019-01-06 NOTE — Telephone Encounter (Signed)
LVM returning patients call to schedule her colonoscopy.  Asked her to call office back to schedule.  Thanks Larnell Granlund 

## 2019-01-06 NOTE — Telephone Encounter (Signed)
Pt is calling for Sharyn Lull to schedule apt

## 2019-01-18 ENCOUNTER — Encounter: Payer: Self-pay | Admitting: *Deleted

## 2019-01-20 ENCOUNTER — Ambulatory Visit (INDEPENDENT_AMBULATORY_CARE_PROVIDER_SITE_OTHER): Payer: Medicare Other | Admitting: Obstetrics and Gynecology

## 2019-01-20 ENCOUNTER — Encounter: Payer: Self-pay | Admitting: Gastroenterology

## 2019-01-20 ENCOUNTER — Encounter: Payer: Self-pay | Admitting: Obstetrics and Gynecology

## 2019-01-20 VITALS — BP 139/81 | HR 88 | Ht 63.0 in | Wt 183.9 lb

## 2019-01-20 DIAGNOSIS — N3946 Mixed incontinence: Secondary | ICD-10-CM

## 2019-01-20 DIAGNOSIS — R350 Frequency of micturition: Secondary | ICD-10-CM

## 2019-01-20 NOTE — Addendum Note (Signed)
Addended by: Durwin Glaze on: 01/20/2019 11:34 AM   Modules accepted: Orders

## 2019-01-20 NOTE — Progress Notes (Signed)
Patient is a new patient to the clinic. She is coming in today to have her sling checked out. Patient states that when she coughs or pushes up she voids. Patient had sling put in on 09/10/2011 by a GYN in [place].

## 2019-01-20 NOTE — Progress Notes (Signed)
HPI:      Amber Mills is a 58 y.o. No obstetric history on file. who LMP was No LMP recorded. Patient has had a hysterectomy.  Subjective:   She presents today with complaint of a 1 year history of urine loss.  After a long discussion it seems as if it may be mostly stress incontinence with a little bit of urge incontinence mixed in.  She previously had a hysterectomy and a "sling procedure".  She does not know if the sling was mesh, porcine or what material was used.  She reports that after her surgery she did not have problems with urine loss for several years.  She states that her urinary problems began approximately 1 year ago when she had a heart attack and started taking multiple medications.    Hx: The following portions of the patient's history were reviewed and updated as appropriate:             She  has a past medical history of Bipolar affective disorder ([place]), GERD (gastroesophageal reflux disease), Hypertension, and Panic attack. She does not have any pertinent problems on file. She  has a past surgical history that includes Tonsillectomy; Hernia repair; Abdominal hysterectomy; bladder mesh; LEFT HEART CATH AND CORONARY ANGIOGRAPHY (N/A, 07/17/2017); and CORONARY STENT INTERVENTION (N/A, 07/17/2017). Her family history includes Breast cancer in her maternal aunt and paternal aunt; CAD in her father. She  reports that she has quit smoking. She has never used smokeless tobacco. She reports that she does not drink alcohol or use drugs. She has a current medication list which includes the following prescription(s): atorvastatin, clonazepam, clopidogrel, clotrimazole-betamethasone, divalproex, olmesartan, pantoprazole, pantoprazole, ranitidine, ticagrelor, and carvedilol. She is allergic to other; zofran [ondansetron hcl]; and penicillins.       Review of Systems:  Review of Systems  Constitutional: Denied constitutional symptoms, night sweats, recent illness, fatigue, fever,  insomnia and weight loss.  Eyes: Denied eye symptoms, eye pain, photophobia, vision change and visual disturbance.  Ears/Nose/Throat/Neck: Denied ear, nose, throat or neck symptoms, hearing loss, nasal discharge, sinus congestion and sore throat.  Cardiovascular: Denied cardiovascular symptoms, arrhythmia, chest pain/pressure, edema, exercise intolerance, orthopnea and palpitations.  Respiratory: Denied pulmonary symptoms, asthma, pleuritic pain, productive sputum, cough, dyspnea and wheezing.  Gastrointestinal: Denied, gastro-esophageal reflux, melena, nausea and vomiting.  Genitourinary: See HPI for additional information.  Musculoskeletal: Denied musculoskeletal symptoms, stiffness, swelling, muscle weakness and myalgia.  Dermatologic: Denied dermatology symptoms, rash and scar.  Neurologic: Denied neurology symptoms, dizziness, headache, neck pain and syncope.  Psychiatric: Denied psychiatric symptoms, anxiety and depression.  Endocrine: Denied endocrine symptoms including hot flashes and night sweats.   Meds:   Current Outpatient Medications on File Prior to Visit  Medication Sig Dispense Refill  . atorvastatin (LIPITOR) 80 MG tablet Take 1 tablet (80 mg total) by mouth daily at 6 PM. 30 tablet 0  . clonazePAM (KLONOPIN) 0.5 MG tablet Take 0.25 mg by mouth 2 (two) times daily.    . clopidogrel (PLAVIX) 75 MG tablet TAKE 1 TABLET BY MOUTH EVERY DAY    . clotrimazole-betamethasone (LOTRISONE) cream Apply topically.    . divalproex (DEPAKOTE ER) 250 MG 24 hr tablet Take 250 mg by mouth daily.    Marland Kitchen olmesartan (BENICAR) 20 MG tablet Take 1 tablet (20 mg total) by mouth daily.    . pantoprazole (PROTONIX) 40 MG tablet Take 1 tablet (40 mg total) by mouth 2 (two) times daily. 60 tablet 0  . pantoprazole (PROTONIX) 40  MG tablet Take by mouth.    . ranitidine (ZANTAC) 300 MG tablet Take 300 mg by mouth daily.    . ticagrelor (BRILINTA) 90 MG TABS tablet Take 1 tablet (90 mg total) by mouth 2  (two) times daily. 60 tablet 2  . carvedilol (COREG) 3.125 MG tablet Take 1 tablet (3.125 mg total) by mouth 2 (two) times daily. 60 tablet 11   No current facility-administered medications on file prior to visit.     Objective:     Vitals:   01/20/19 1038  BP: 139/81  Pulse: 88              Physical examination   Pelvic:   Vulva: Normal appearance.  No lesions.  Vagina: No lesions or abnormalities noted.  Support:  Third-degree rectocele second-degree cystocele  Urethra No masses tenderness or scarring.  Meatus Normal size without lesions or prolapse.  Cervix:  Surgically absent  Anus: Normal exam.  No lesions.  Perineum: Normal exam.  No lesions.        Bimanual   Uterus:  Surgically absent  Adnexae: No masses.  Non-tender to palpation.  Cul-de-sac: Negative for abnormality.     Assessment:    No obstetric history on file. Patient Active Problem List   Diagnosis Date Noted  . NSTEMI (non-ST elevated myocardial infarction) ([place]) 07/16/2017     1. Urinary frequency   2. Mixed stress and urge urinary incontinence     Patient attributes the start of her most recent urinary issues to the timing of new medications after her heart attack.  I believe this may be a possibility and I have advised her that she speak with her cardiologist regarding current medications. There is no evidence of mesh issues or mesh erosions in the vagina.  However I cannot easily palpate the mesh so I am not sure if mesh was actually used for her sling. She states that last week at another doctor's office she noted some blood in her urine but this was not further tested.   Plan:            1.  Urine for C&S to rule out bladder infection as a cause for incontinence.  2.  Patient to speak with her cardiologist regarding the possibility that her current medication list is increasing her urinary urgency.  3.  To inquire whether Myrbetriq or other urgency medications would be safe to take  concomitantly with her cardiac meds, if so her cardiologist may prescribe or I will prescribe with his okay. Orders No orders of the defined types were placed in this encounter.   No orders of the defined types were placed in this encounter.     F/U  Return for Pt to contact us if symptoms worsen. I spent 33 minutes involved in the care of this patient of which greater than 50% was spent discussing urine loss, sling procedure, cystocele rectocele, recent heart attack and current medication list, cholesterol-lowering drugs, menopause, all questions answered.  Finis Bud, M.D. 01/20/2019 11:27 AM

## 2019-01-22 LAB — URINE CULTURE

## 2019-02-22 ENCOUNTER — Other Ambulatory Visit: Payer: Self-pay

## 2019-02-23 ENCOUNTER — Ambulatory Visit (INDEPENDENT_AMBULATORY_CARE_PROVIDER_SITE_OTHER): Payer: Medicare Other | Admitting: Gastroenterology

## 2019-02-23 ENCOUNTER — Encounter: Payer: Self-pay | Admitting: Gastroenterology

## 2019-02-23 ENCOUNTER — Other Ambulatory Visit: Payer: Self-pay

## 2019-02-23 VITALS — BP 147/88 | HR 108 | Resp 18 | Ht 63.0 in | Wt 183.4 lb

## 2019-02-23 DIAGNOSIS — I10 Essential (primary) hypertension: Secondary | ICD-10-CM | POA: Insufficient documentation

## 2019-02-23 DIAGNOSIS — K6289 Other specified diseases of anus and rectum: Secondary | ICD-10-CM

## 2019-02-23 DIAGNOSIS — E785 Hyperlipidemia, unspecified: Secondary | ICD-10-CM | POA: Insufficient documentation

## 2019-02-23 DIAGNOSIS — F419 Anxiety disorder, unspecified: Secondary | ICD-10-CM | POA: Insufficient documentation

## 2019-02-23 DIAGNOSIS — K580 Irritable bowel syndrome with diarrhea: Secondary | ICD-10-CM

## 2019-02-23 MED ORDER — DICYCLOMINE HCL 10 MG PO CAPS: 10.0000 mg | ORAL_CAPSULE | Freq: Three times a day (TID) | ORAL | 0 refills | Status: DC | PRN

## 2019-02-23 NOTE — Progress Notes (Signed)
Cephas Darby, MD [address]  [place]  [place], [place] [address]  Main: [phone]  Fax: [phone]    Gastroenterology Consultation  Referring Provider:     Idelle Crouch, MD Primary Care Physician:  Idelle Crouch, MD Primary Gastroenterologist:  Dr. Cephas Darby Reason for Consultation:     Altered bowel habits        HPI:   Amber Mills is a 58 y.o. female referred by Dr. Doy Hutching, Leonie Douglas, MD  for consultation & management of altered bowel habits.  Patient reports that she has been experiencing upper abdominal pain, episodes of intermittent nonbloody diarrhea.  When she had a heart attack in 2018, she experienced about of severe nonbloody diarrhea, she thought it was secondary to her medications, readjusted the dose and her diarrhea resolved.  She does have history of bipolar, panic attacks.  Currently, she reports having bilateral burning upper abdominal pain, regular bowel movements, formed consistency, nonbloody.  She was evaluated at [facility] in 09/2018 for upper abdominal pain, and this was thought to be secondary to nonulcer dyspepsia.  She had multiple EGDs and colonoscopies in the past which were unremarkable.  She does drink 5 to 6 cans of diet soda daily.  She is taking Protonix 40 mg daily as well as ranitidine 300 mg daily.  Protonix does help with burning abdominal pain but temporarily. She denies weight loss, nausea, vomiting, weight loss, right upper quadrant discomfort.  She had right upper quadrant ultrasound which revealed adenomyomatosis of the gallbladder but no evidence of cholelithiasis.  Also,  has fatty liver  NSAIDs: None  Antiplts/Anticoagulants/Anti thrombotics: Plavix with history of coronary disease  GI Procedures: colonoscopy 02/12/12 and 06/12/15 showing a TA and HP's, respectively EGD 05/31/15 showing mild duodenitis; biopsies for negative for Celiac disease and H pylori   Past Medical History:  Diagnosis Date  . Bipolar  affective disorder ([place])   . GERD (gastroesophageal reflux disease)   . Hypertension   . Panic attack     Past Surgical History:  Procedure Laterality Date  . ABDOMINAL HYSTERECTOMY    . bladder mesh    . CORONARY STENT INTERVENTION N/A 07/17/2017   Procedure: Coronary Stent Intervention;  Surgeon: Yolonda Kida, MD;  Location: [place] [facility];  Service: Cardiovascular;  Laterality: N/A;  . HERNIA REPAIR    . LEFT HEART CATH AND CORONARY ANGIOGRAPHY N/A 07/17/2017   Procedure: Left Heart Cath and Coronary Angiography and possible pci;  Surgeon: Yolonda Kida, MD;  Location: [place] [facility];  Service: Cardiovascular;  Laterality: N/A;  . TONSILLECTOMY      Current Outpatient Medications:  .  atorvastatin (LIPITOR) 80 MG tablet, Take 1 tablet (80 mg total) by mouth daily at 6 PM., Disp: 30 tablet, Rfl: 0 .  clonazePAM (KLONOPIN) 0.5 MG tablet, Take 0.25 mg by mouth 2 (two) times daily., Disp: , Rfl:  .  clopidogrel (PLAVIX) 75 MG tablet, TAKE 1 TABLET BY MOUTH EVERY DAY, Disp: , Rfl:  .  clotrimazole-betamethasone (LOTRISONE) cream, Apply topically., Disp: , Rfl:  .  divalproex (DEPAKOTE) 250 MG DR tablet, Take 250 mg by mouth 2 (two) times daily., Disp: , Rfl:  .  olmesartan (BENICAR) 40 MG tablet, Take 40 mg by mouth daily., Disp: , Rfl:  .  pantoprazole (PROTONIX) 40 MG tablet, Take 1 tablet (40 mg total) by mouth 2 (two) times daily., Disp: 60 tablet, Rfl: 0 .  ranitidine (ZANTAC) 300 MG  tablet, Take 300 mg by mouth daily., Disp: , Rfl:  .  amitriptyline (ELAVIL) 25 MG tablet, , Disp: , Rfl:  .  carvedilol (COREG) 3.125 MG tablet, Take 1 tablet (3.125 mg total) by mouth 2 (two) times daily., Disp: 60 tablet, Rfl: 11 .  dicyclomine (BENTYL) 10 MG capsule, Take 1 capsule (10 mg total) by mouth 3 (three) times daily as needed for up to 30 doses for spasms., Disp: 30 capsule, Rfl: 0 .  diltiazem (CARDIZEM CD) 120 MG 24 hr capsule, Take by mouth., Disp: , Rfl:  .   divalproex (DEPAKOTE ER) 250 MG 24 hr tablet, Take 250 mg by mouth daily., Disp: , Rfl:  .  levocetirizine (XYZAL) 5 MG tablet, Take by mouth., Disp: , Rfl:  .  levocetirizine (XYZAL) 5 MG tablet, every evening., Disp: , Rfl:  .  olmesartan (BENICAR) 20 MG tablet, Take 1 tablet (20 mg total) by mouth daily. (Patient not taking: Reported on 02/23/2019), Disp: , Rfl:  .  pantoprazole (PROTONIX) 40 MG tablet, Take by mouth., Disp: , Rfl:  .  silver sulfADIAZINE (SILVADENE) 1 % cream, Apply topically., Disp: , Rfl:  .  ticagrelor (BRILINTA) 90 MG TABS tablet, Take 1 tablet (90 mg total) by mouth 2 (two) times daily. (Patient not taking: Reported on 02/23/2019), Disp: 60 tablet, Rfl: 2   Family History  Problem Relation Age of Onset  . CAD Father   . Breast cancer Maternal Aunt   . Breast cancer Paternal Aunt      Social History   Tobacco Use  . Smoking status: Former Research scientist (life sciences)  . Smokeless tobacco: Never Used  Substance Use Topics  . Alcohol use: No  . Drug use: No    Allergies as of 02/23/2019 - Review Complete 02/23/2019  Allergen Reaction Noted  . Other Anaphylaxis 07/16/2017  . Zofran [ondansetron hcl] Itching 07/17/2017  . Buspirone  10/21/2018  . Haloperidol  10/21/2018  . Sertraline  10/21/2018  . Penicillins Rash 07/16/2017    Review of Systems:    All systems reviewed and negative except where noted in HPI.   Physical Exam:  BP (!) 147/88 (BP Location: Left Arm, Patient Position: Sitting, Cuff Size: Large)   Pulse (!) 108   Resp 18   Ht 5\' 3"  (1.6 m)   Wt 183 lb 6.4 oz (83.2 kg)   BMI 32.49 kg/m  No LMP recorded. Patient has had a hysterectomy.  General:   Alert,  Well-developed, well-nourished, pleasant and cooperative in NAD Head:  Normocephalic and atraumatic. Eyes:  Sclera clear, no icterus.   Conjunctiva pink. Ears:  Normal auditory acuity. Nose:  No deformity, discharge, or lesions. Mouth:  No deformity or lesions,oropharynx pink & moist. Neck:  Supple; no  masses or thyromegaly. Lungs:  Respirations even and unlabored.  Clear throughout to auscultation.   No wheezes, crackles, or rhonchi. No acute distress. Heart:  Regular rate and rhythm; no murmurs, clicks, rubs, or gallops. Abdomen:  Normal bowel sounds. Soft, non-tender and non-distended without masses, hepatosplenomegaly or hernias noted.  No guarding or rebound tenderness.   Rectal: Normal perianal exam, scar tissue from previous episiotomy, soft brown stool in the rectal vault, nontender, no palpable lesions identified except hemorrhoids Msk:  Symmetrical without gross deformities. Good, equal movement & strength bilaterally. Pulses:  Normal pulses noted. Extremities:  No clubbing or edema.  No cyanosis. Neurologic:  Alert and oriented x3;  grossly normal neurologically. Skin:  Intact without significant lesions or rashes. No jaundice.  Psych:  Alert and cooperative. Normal mood and affect.  Imaging Studies: Reviewed  Assessment and Plan:   Amber Mills is a 57 y.o. female with history of coronary disease, status post PCI with stent placement on Plavix 75 mg daily with intermittent episodes of nonbloody diarrhea with burning abdominal pain, regular intake of carbonated beverages.  Patient had EGD and colonoscopy in the past which were unremarkable.  Currently, she is not experiencing diarrhea.  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  I reassured her that her symptoms are most likely functional secondary to anxiety and bipolar  My recommendations are She will continue Protonix 40 mg daily as she is on Plavix for long-term Discontinue ranitidine Stop all the carbonated beverages Trial of Bentyl as needed for abdominal cramps Trial of FD guard   Follow up in 2 months   Cephas Darby, MD

## 2019-04-05 ENCOUNTER — Other Ambulatory Visit: Payer: Self-pay | Admitting: Gastroenterology

## 2019-04-05 DIAGNOSIS — K580 Irritable bowel syndrome with diarrhea: Secondary | ICD-10-CM

## 2019-04-27 ENCOUNTER — Ambulatory Visit: Payer: Medicare Other | Admitting: Gastroenterology

## 2019-07-05 DIAGNOSIS — F419 Anxiety disorder, unspecified: Secondary | ICD-10-CM | POA: Diagnosis not present

## 2019-07-05 DIAGNOSIS — Z0000 Encounter for general adult medical examination without abnormal findings: Secondary | ICD-10-CM | POA: Diagnosis not present

## 2019-07-05 DIAGNOSIS — I10 Essential (primary) hypertension: Secondary | ICD-10-CM | POA: Diagnosis not present

## 2019-07-05 DIAGNOSIS — F319 Bipolar disorder, unspecified: Secondary | ICD-10-CM | POA: Diagnosis not present

## 2019-07-05 DIAGNOSIS — Z79899 Other long term (current) drug therapy: Secondary | ICD-10-CM | POA: Diagnosis not present

## 2019-07-05 DIAGNOSIS — E7800 Pure hypercholesterolemia, unspecified: Secondary | ICD-10-CM | POA: Diagnosis not present

## 2019-07-05 DIAGNOSIS — I25118 Atherosclerotic heart disease of native coronary artery with other forms of angina pectoris: Secondary | ICD-10-CM | POA: Insufficient documentation

## 2019-07-12 ENCOUNTER — Ambulatory Visit: Payer: Medicare Other | Admitting: Gastroenterology

## 2019-08-09 DIAGNOSIS — I214 Non-ST elevation (NSTEMI) myocardial infarction: Secondary | ICD-10-CM | POA: Diagnosis not present

## 2019-08-09 DIAGNOSIS — I2510 Atherosclerotic heart disease of native coronary artery without angina pectoris: Secondary | ICD-10-CM | POA: Diagnosis not present

## 2019-08-09 DIAGNOSIS — F419 Anxiety disorder, unspecified: Secondary | ICD-10-CM | POA: Diagnosis not present

## 2019-08-09 DIAGNOSIS — I200 Unstable angina: Secondary | ICD-10-CM | POA: Diagnosis not present

## 2019-08-09 DIAGNOSIS — E669 Obesity, unspecified: Secondary | ICD-10-CM | POA: Diagnosis not present

## 2019-08-09 DIAGNOSIS — I10 Essential (primary) hypertension: Secondary | ICD-10-CM | POA: Diagnosis not present

## 2019-08-09 DIAGNOSIS — Z955 Presence of coronary angioplasty implant and graft: Secondary | ICD-10-CM | POA: Diagnosis not present

## 2019-08-09 DIAGNOSIS — F319 Bipolar disorder, unspecified: Secondary | ICD-10-CM | POA: Diagnosis not present

## 2019-08-09 DIAGNOSIS — R0602 Shortness of breath: Secondary | ICD-10-CM | POA: Diagnosis not present

## 2019-08-23 ENCOUNTER — Encounter: Payer: Self-pay | Admitting: Gastroenterology

## 2019-08-23 ENCOUNTER — Ambulatory Visit: Payer: Medicare HMO | Admitting: Gastroenterology

## 2020-02-25 ENCOUNTER — Other Ambulatory Visit: Payer: Self-pay | Admitting: Internal Medicine

## 2020-02-25 DIAGNOSIS — Z1231 Encounter for screening mammogram for malignant neoplasm of breast: Secondary | ICD-10-CM

## 2020-06-30 ENCOUNTER — Other Ambulatory Visit: Payer: Self-pay | Admitting: Internal Medicine

## 2020-06-30 DIAGNOSIS — Z0000 Encounter for general adult medical examination without abnormal findings: Secondary | ICD-10-CM

## 2020-06-30 DIAGNOSIS — M79661 Pain in right lower leg: Secondary | ICD-10-CM

## 2020-07-10 ENCOUNTER — Ambulatory Visit: Payer: Medicare Other

## 2020-07-11 ENCOUNTER — Ambulatory Visit: Payer: Medicare Other

## 2020-07-13 ENCOUNTER — Other Ambulatory Visit: Payer: Self-pay

## 2020-07-13 ENCOUNTER — Other Ambulatory Visit: Payer: Self-pay | Admitting: Gastroenterology

## 2020-07-13 ENCOUNTER — Telehealth (INDEPENDENT_AMBULATORY_CARE_PROVIDER_SITE_OTHER): Payer: Self-pay | Admitting: Gastroenterology

## 2020-07-13 DIAGNOSIS — Z86010 Personal history of colonic polyps: Secondary | ICD-10-CM

## 2020-07-13 MED ORDER — PEG 3350-KCL-NA BICARB-NACL 420 G PO SOLR: 4000.0000 mL | Freq: Once | ORAL | 0 refills | Status: AC

## 2020-07-13 NOTE — Progress Notes (Signed)
Gastroenterology Pre-Procedure Review  Request Date: Monday 08/21/20 Requesting Physician: Dr. Marius Ditch  PATIENT REVIEW QUESTIONS: The patient responded to the following health history questions as indicated:   Note:  Patient states that she has "[address] intestines"  1. Are you having any GI issues? yes (IBS, acid reflux.  Treated by Dr. Marius Ditch 02/23/19.  Patient states that her reflux has been bothering her.  Upon medication review I asked if she were taking her protonix once or twice a day she said once.  I informed her that her chart noted twice a day.  She plans on increasing to twice a day.l) 2. Do you have a personal history of Polyps? yes (2016 patient had colonoscopy in [place].  She states she did have polyps.  ) 3. Do you have a family history of Colon Cancer or Polyps? no 4. Diabetes Mellitus? no 5. Joint replacements in the past 12 months?no 6. Major health problems in the past 3 months?no 7. Any artificial heart valves, MVP, or defibrillator?no pt request cardiac clearance to be sent to Dr. Clayborn Bigness due to cardiac history she was advised to have this done prior to her colonoscopy    MEDICATIONS & ALLERGIES:    Patient reports the following regarding taking any anticoagulation/antiplatelet therapy:   Plavix, Coumadin, Eliquis, Xarelto, Lovenox, Pradaxa, Brilinta, or Effient? no Aspirin? yes (81 mg)  Patient confirms/reports the following medications:  Current Outpatient Medications  Medication Sig Dispense Refill  . aspirin 81 MG EC tablet Take by mouth.    Marland Kitchen atorvastatin (LIPITOR) 80 MG tablet Take 1 tablet (80 mg total) by mouth daily at 6 PM. 30 tablet 0  . clonazePAM (KLONOPIN) 0.5 MG tablet Take 0.25 mg by mouth 2 (two) times daily.    Marland Kitchen olmesartan (BENICAR) 20 MG tablet Take 1 tablet (20 mg total) by mouth daily.    . pantoprazole (PROTONIX) 40 MG tablet Take 1 tablet (40 mg total) by mouth 2 (two) times daily. 60 tablet 0  . amitriptyline (ELAVIL) 25 MG tablet   (Patient not taking: Reported on 07/13/2020)    . carvedilol (COREG) 3.125 MG tablet Take 1 tablet (3.125 mg total) by mouth 2 (two) times daily. 60 tablet 11  . clopidogrel (PLAVIX) 75 MG tablet TAKE 1 TABLET BY MOUTH EVERY DAY (Patient not taking: Reported on 07/13/2020)    . dicyclomine (BENTYL) 10 MG capsule TAKE 1 CAPSULE (10 MG TOTAL) BY MOUTH 3 (THREE) TIMES DAILY AS NEEDED FOR UP TO 30 DOSES FOR SPASMS. (Patient not taking: Reported on 07/13/2020) 30 capsule 0  . diltiazem (CARDIZEM CD) 120 MG 24 hr capsule Take by mouth.    . divalproex (DEPAKOTE ER) 250 MG 24 hr tablet Take 250 mg by mouth daily. (Patient not taking: Reported on 07/13/2020)    . divalproex (DEPAKOTE) 250 MG DR tablet Take 250 mg by mouth 2 (two) times daily. (Patient not taking: Reported on 07/13/2020)    . levocetirizine (XYZAL) 5 MG tablet Take by mouth.    . levocetirizine (XYZAL) 5 MG tablet every evening. (Patient not taking: Reported on 07/13/2020)    . olmesartan (BENICAR) 40 MG tablet Take 40 mg by mouth daily. (Patient not taking: Reported on 07/13/2020)    . pantoprazole (PROTONIX) 40 MG tablet Take by mouth. (Patient not taking: Reported on 07/13/2020)    . polyethylene glycol-electrolytes (NULYTELY) 420 g solution Take 4,000 mLs by mouth once for 1 dose. 4000 mL 0  . ranitidine (ZANTAC) 300 MG tablet Take 300 mg  by mouth daily. (Patient not taking: Reported on 07/13/2020)    . ticagrelor (BRILINTA) 90 MG TABS tablet Take 1 tablet (90 mg total) by mouth 2 (two) times daily. (Patient not taking: Reported on 02/23/2019) 60 tablet 2   No current facility-administered medications for this visit.    Patient confirms/reports the following allergies:  Allergies  Allergen Reactions  . Other Anaphylaxis    Ex lax  . Zofran [Ondansetron Hcl] Itching  . Buspirone   . Haloperidol   . Sertraline   . Penicillins Rash    .Has patient had a PCN reaction causing immediate rash, facial/tongue/throat swelling, SOB or  lightheadedness with hypotension: No Has patient had a PCN reaction causing severe rash involving mucus membranes or skin necrosis: No Has patient had a PCN reaction that required hospitalization: No Has patient had a PCN reaction occurring within the last 10 years: No If all of the above answers are "NO", then may proceed with Cephalosporin use.     Orders Placed This Encounter  Procedures  . Procedural/ Surgical Case Request: COLONOSCOPY WITH PROPOFOL    Standing Status:   Standing    Number of Occurrences:   1    Order Specific Question:   Pre-op diagnosis    Answer:   history of colon polyps    Order Specific Question:   CPT Code    Answer:   38887    AUTHORIZATION INFORMATION Primary Insurance: 1D#: Group #:  Secondary Insurance: 1D#: Group #:  SCHEDULE INFORMATION: Date: Monday 08/21/20 Time: Location:[facility]

## 2020-08-17 ENCOUNTER — Other Ambulatory Visit: Payer: Self-pay

## 2020-08-17 ENCOUNTER — Other Ambulatory Visit
Admission: RE | Admit: 2020-08-17 | Discharge: 2020-08-17 | Disposition: A | Discharge status: Home / Self Care | Payer: Medicare Other | Source: Ambulatory Visit | Attending: Gastroenterology | Admitting: Gastroenterology

## 2020-08-17 MED ORDER — PEG 3350-KCL-NA BICARB-NACL 420 G PO SOLR: 4000.0000 mL | Freq: Once | ORAL | 0 refills | Status: AC

## 2020-08-18 ENCOUNTER — Other Ambulatory Visit
Admission: RE | Admit: 2020-08-18 | Discharge: 2020-08-18 | Disposition: A | Discharge status: Home / Self Care | Payer: Medicare Other | Source: Ambulatory Visit | Attending: Gastroenterology | Admitting: Gastroenterology

## 2020-08-18 ENCOUNTER — Other Ambulatory Visit: Payer: Self-pay

## 2020-08-18 DIAGNOSIS — Z01812 Encounter for preprocedural laboratory examination: Secondary | ICD-10-CM | POA: Insufficient documentation

## 2020-08-18 DIAGNOSIS — Z20822 Contact with and (suspected) exposure to covid-19: Secondary | ICD-10-CM | POA: Diagnosis not present

## 2020-08-18 LAB — SARS CORONAVIRUS 2 (TAT 6-24 HRS): SARS Coronavirus 2: NEGATIVE

## 2020-08-21 ENCOUNTER — Encounter: Admission: RE | Payer: Self-pay | Source: Home / Self Care

## 2020-08-21 ENCOUNTER — Telehealth: Payer: Self-pay | Admitting: Gastroenterology

## 2020-08-21 ENCOUNTER — Ambulatory Visit: Admission: RE | Admit: 2020-08-21 | Payer: Medicare Other | Source: Home / Self Care | Admitting: Gastroenterology

## 2020-08-21 SURGERY — COLONOSCOPY WITH PROPOFOL
Anesthesia: General

## 2020-08-21 MED ORDER — PROPOFOL 500 MG/50ML IV EMUL
INTRAVENOUS | Status: AC
  Filled 2020-08-21: qty 50

## 2020-08-21 NOTE — Telephone Encounter (Signed)
Patient states the Sutab pills made her so sick, she vomited numerous times and had to cancel her procedure for today 8.23.21. Pt wanting a call back to resch and get a different prep.

## 2020-08-22 ENCOUNTER — Other Ambulatory Visit: Payer: Self-pay

## 2020-08-22 DIAGNOSIS — Z86010 Personal history of colonic polyps: Secondary | ICD-10-CM

## 2020-08-22 MED ORDER — NA SULFATE-K SULFATE-MG SULF 17.5-3.13-1.6 GM/177ML PO SOLN: 354.0000 mL | Freq: Once | ORAL | 0 refills | Status: AC

## 2020-08-22 NOTE — Telephone Encounter (Signed)
Rescheduled patient to 09/07/2020. Sent suprep to the pharmacy

## 2020-09-05 ENCOUNTER — Other Ambulatory Visit
Admission: RE | Admit: 2020-09-05 | Discharge: 2020-09-05 | Disposition: A | Discharge status: Home / Self Care | Payer: Medicare Other | Source: Ambulatory Visit | Attending: Gastroenterology | Admitting: Gastroenterology

## 2020-09-05 ENCOUNTER — Other Ambulatory Visit: Payer: Self-pay

## 2020-09-05 DIAGNOSIS — Z20822 Contact with and (suspected) exposure to covid-19: Secondary | ICD-10-CM | POA: Diagnosis not present

## 2020-09-05 DIAGNOSIS — Z01812 Encounter for preprocedural laboratory examination: Secondary | ICD-10-CM | POA: Insufficient documentation

## 2020-09-06 ENCOUNTER — Encounter: Payer: Self-pay | Admitting: Gastroenterology

## 2020-09-06 LAB — SARS CORONAVIRUS 2 (TAT 6-24 HRS): SARS Coronavirus 2: NEGATIVE

## 2020-09-07 ENCOUNTER — Encounter: Payer: Self-pay | Admitting: Gastroenterology

## 2020-09-07 ENCOUNTER — Ambulatory Visit
Admission: RE | Admit: 2020-09-07 | Discharge: 2020-09-07 | Disposition: A | Discharge status: Home / Self Care | Payer: Medicare Other | Attending: Gastroenterology | Admitting: Gastroenterology

## 2020-09-07 ENCOUNTER — Encounter
Admission: RE | Disposition: A | Discharge status: Home / Self Care | Payer: Self-pay | Source: Home / Self Care | Attending: Gastroenterology

## 2020-09-07 ENCOUNTER — Ambulatory Visit: Payer: Medicare Other | Admitting: Certified Registered"

## 2020-09-07 ENCOUNTER — Other Ambulatory Visit: Payer: Self-pay

## 2020-09-07 DIAGNOSIS — K635 Polyp of colon: Secondary | ICD-10-CM

## 2020-09-07 DIAGNOSIS — Z955 Presence of coronary angioplasty implant and graft: Secondary | ICD-10-CM | POA: Insufficient documentation

## 2020-09-07 DIAGNOSIS — D124 Benign neoplasm of descending colon: Secondary | ICD-10-CM | POA: Diagnosis not present

## 2020-09-07 DIAGNOSIS — I10 Essential (primary) hypertension: Secondary | ICD-10-CM | POA: Diagnosis not present

## 2020-09-07 DIAGNOSIS — K5730 Diverticulosis of large intestine without perforation or abscess without bleeding: Secondary | ICD-10-CM | POA: Diagnosis not present

## 2020-09-07 DIAGNOSIS — E785 Hyperlipidemia, unspecified: Secondary | ICD-10-CM | POA: Diagnosis not present

## 2020-09-07 DIAGNOSIS — Z888 Allergy status to other drugs, medicaments and biological substances status: Secondary | ICD-10-CM | POA: Diagnosis not present

## 2020-09-07 DIAGNOSIS — K219 Gastro-esophageal reflux disease without esophagitis: Secondary | ICD-10-CM | POA: Diagnosis not present

## 2020-09-07 DIAGNOSIS — Z860100 Personal history of colon polyps, unspecified: Secondary | ICD-10-CM

## 2020-09-07 DIAGNOSIS — I2510 Atherosclerotic heart disease of native coronary artery without angina pectoris: Secondary | ICD-10-CM | POA: Insufficient documentation

## 2020-09-07 DIAGNOSIS — D123 Benign neoplasm of transverse colon: Secondary | ICD-10-CM | POA: Diagnosis not present

## 2020-09-07 DIAGNOSIS — Z880 Allergy status to penicillin: Secondary | ICD-10-CM | POA: Diagnosis not present

## 2020-09-07 DIAGNOSIS — Z87891 Personal history of nicotine dependence: Secondary | ICD-10-CM | POA: Diagnosis not present

## 2020-09-07 DIAGNOSIS — F319 Bipolar disorder, unspecified: Secondary | ICD-10-CM | POA: Insufficient documentation

## 2020-09-07 DIAGNOSIS — Z79899 Other long term (current) drug therapy: Secondary | ICD-10-CM | POA: Insufficient documentation

## 2020-09-07 DIAGNOSIS — Z1211 Encounter for screening for malignant neoplasm of colon: Secondary | ICD-10-CM | POA: Insufficient documentation

## 2020-09-07 DIAGNOSIS — I252 Old myocardial infarction: Secondary | ICD-10-CM | POA: Insufficient documentation

## 2020-09-07 HISTORY — PX: COLONOSCOPY WITH PROPOFOL: SHX5780

## 2020-09-07 HISTORY — DX: Acute myocardial infarction, unspecified: I21.9

## 2020-09-07 HISTORY — DX: Hyperlipidemia, unspecified: E78.5

## 2020-09-07 SURGERY — COLONOSCOPY WITH PROPOFOL
Anesthesia: General

## 2020-09-07 MED ORDER — VASOPRESSIN 20 UNIT/ML IV SOLN
INTRAVENOUS | Status: DC | PRN
  Administered 2020-09-07 (×3): 2 [IU] via INTRAVENOUS

## 2020-09-07 MED ORDER — PROPOFOL 500 MG/50ML IV EMUL
INTRAVENOUS | Status: DC | PRN
  Administered 2020-09-07: 165 ug/kg/min via INTRAVENOUS

## 2020-09-07 MED ORDER — PROPOFOL 10 MG/ML IV BOLUS
INTRAVENOUS | Status: DC | PRN
  Administered 2020-09-07: 20 mg via INTRAVENOUS

## 2020-09-07 MED ORDER — MIDAZOLAM HCL 2 MG/2ML IJ SOLN
INTRAMUSCULAR | Status: DC | PRN
  Administered 2020-09-07: 2 mg via INTRAVENOUS

## 2020-09-07 MED ORDER — GLYCOPYRROLATE 0.2 MG/ML IJ SOLN
INTRAMUSCULAR | Status: DC | PRN
  Administered 2020-09-07: .2 mg via INTRAVENOUS

## 2020-09-07 MED ORDER — LIDOCAINE HCL (CARDIAC) PF 100 MG/5ML IV SOSY
PREFILLED_SYRINGE | INTRAVENOUS | Status: DC | PRN
  Administered 2020-09-07: 100 mg via INTRAVENOUS

## 2020-09-07 MED ORDER — MIDAZOLAM HCL 2 MG/2ML IJ SOLN
INTRAMUSCULAR | Status: AC
  Filled 2020-09-07: qty 2

## 2020-09-07 MED ORDER — SODIUM CHLORIDE 0.9 % IV SOLN: INTRAVENOUS | Status: DC

## 2020-09-07 NOTE — Anesthesia Procedure Notes (Signed)
Procedure Name: General with mask airway Performed by: Fletcher-Harrison, Dezi Schaner, CRNA Pre-anesthesia Checklist: Patient identified, Emergency Drugs available, Suction available and Patient being monitored Patient Re-evaluated:Patient Re-evaluated prior to induction Oxygen Delivery Method: Simple face mask Induction Type: IV induction Placement Confirmation: positive ETCO2 and CO2 detector Dental Injury: Teeth and Oropharynx as per pre-operative assessment        

## 2020-09-07 NOTE — Transfer of Care (Signed)
Immediate Anesthesia Transfer of Care Note  Patient: Amber Mills  Procedure(s) Performed: COLONOSCOPY WITH PROPOFOL (N/A )  Patient Location: Endoscopy Unit  Anesthesia Type:General  Level of Consciousness: drowsy and patient cooperative  Airway & Oxygen Therapy: Patient Spontanous Breathing and Patient connected to face mask oxygen  Post-op Assessment: Report given to RN and Post -op Vital signs reviewed and stable  Post vital signs: Reviewed and stable  Last Vitals:  Vitals Value Taken Time  BP 99/49 09/07/20 1133  Temp 36.2 C 09/07/20 1132  Pulse 74 09/07/20 1133  Resp 22 09/07/20 1133  SpO2 100 % 09/07/20 1133  Vitals shown include unvalidated device data.  Last Pain:  Vitals:   09/07/20 1132  TempSrc: Temporal  PainSc:          Complications: No complications documented.

## 2020-09-07 NOTE — Op Note (Signed)
[facility] Gastroenterology Patient Name: Amber Mills Procedure Date: 09/07/2020 10:38 AM MRN: [id number] Account #: [id number] Date of Birth: [birth year] Admit Type: Outpatient Age: 59 Room: [facility] ENDO ROOM 3 Gender: Female Note Status: Finalized Procedure:             Colonoscopy Indications:           High risk colon cancer surveillance: Personal history                         of colonic polyps, Last colonoscopy: January 2006 Providers:             Lin Landsman MD, MD Referring MD:          No Local Md, MD (Referring MD) Medicines:             Monitored Anesthesia Care Complications:         No immediate complications. Estimated blood loss: None. Procedure:             Pre-Anesthesia Assessment:                        - Prior to the procedure, a History and Physical was                         performed, and patient medications and allergies were                         reviewed. The patient is competent. The risks and                         benefits of the procedure and the sedation options and                         risks were discussed with the patient. All questions                         were answered and informed consent was obtained.                         Patient identification and proposed procedure were                         verified by the physician, the nurse, the                         anesthesiologist, the anesthetist and the technician                         in the pre-procedure area in the procedure room in the                         endoscopy suite. Mental Status Examination: alert and                         oriented. Airway Examination: normal oropharyngeal                         airway and neck mobility. Respiratory Examination:  clear to auscultation. CV Examination: normal.                         Prophylactic Antibiotics: The patient does not require                         prophylactic antibiotics.  Prior Anticoagulants: The                         patient has taken no previous anticoagulant or                         antiplatelet agents. ASA Grade Assessment: III - A                         patient with severe systemic disease. After reviewing                         the risks and benefits, the patient was deemed in                         satisfactory condition to undergo the procedure. The                         anesthesia plan was to use monitored anesthesia care                         (MAC). Immediately prior to administration of                         medications, the patient was re-assessed for adequacy                         to receive sedatives. The heart rate, respiratory                         rate, oxygen saturations, blood pressure, adequacy of                         pulmonary ventilation, and response to care were                         monitored throughout the procedure. The physical                         status of the patient was re-assessed after the                         procedure.                        After obtaining informed consent, the colonoscope was                         passed under direct vision. Throughout the procedure,                         the patient's blood pressure, pulse, and oxygen  saturations were monitored continuously. The                         Colonoscope was introduced through the anus and                         advanced to the the terminal ileum, with                         identification of the appendiceal orifice and IC                         valve. The colonoscopy was performed without                         difficulty. The patient tolerated the procedure well.                         The quality of the bowel preparation was evaluated                         using the BBPS [facility] Bowel Preparation Scale) with                         scores of: Right Colon = 3, Transverse Colon = 3 and                          Left Colon = 3 (entire mucosa seen well with no                         residual staining, small fragments of stool or opaque                         liquid). The total BBPS score equals 9. Findings:      The perianal and digital rectal examinations were normal. Pertinent       negatives include normal sphincter tone and no palpable rectal lesions.      A 15 mm polyp was found in the transverse colon. The polyp was sessile.       The polyp was removed with a hot snare. Resection and retrieval were       complete. To prevent bleeding after the polypectomy, one hemostatic clip       was successfully placed (MR conditional). There was no bleeding during,       or at the end, of the procedure.      A 6 mm polyp was found in the transverse colon. The polyp was sessile.       Polypectomy was attempted, initially using a cold snare. Polyp resection       was incomplete with this device. This intervention then required a       different device and polypectomy technique. The polyp was removed with a       hot snare. Resection and retrieval were complete.      A 15 mm polyp was found in the descending colon. The polyp was sessile.       The polyp was removed with a hot snare. Resection and retrieval were       complete.      A  5 mm polyp was found in the transverse colon. The polyp was sessile.       The polyp was removed with a cold snare. Resection and retrieval were       complete.      The retroflexed view of the distal rectum and anal verge was normal and       showed no anal or rectal abnormalities.      Multiple diverticula were found in the sigmoid colon.      The terminal ileum appeared normal. Impression:            - One 15 mm polyp in the transverse colon, removed                         with a hot snare. Resected and retrieved. Clip (MR                         conditional) was placed.                        - One 6 mm polyp in the transverse colon, removed with                          a hot snare. Resected and retrieved.                        - One 15 mm polyp in the descending colon, removed                         with a hot snare. Resected and retrieved.                        - One 5 mm polyp in the transverse colon, removed with                         a cold snare. Resected and retrieved.                        - The distal rectum and anal verge are normal on                         retroflexion view.                        - Diverticulosis in the sigmoid colon. Recommendation:        - Discharge patient to home (with escort).                        - Resume previous diet today.                        - Continue present medications.                        - Await pathology results.                        - Repeat colonoscopy in 3 years for surveillance. Procedure Code(s):     --- Professional ---  45385, Colonoscopy, flexible; with removal of                         tumor(s), polyp(s), or other lesion(s) by snare                         technique Diagnosis Code(s):     --- Professional ---                        Z86.010, Personal history of colonic polyps                        K63.5, Polyp of colon                        K57.30, Diverticulosis of large intestine without                         perforation or abscess without bleeding CPT copyright 2019 American Medical Association. All rights reserved. The codes documented in this report are preliminary and upon coder review may  be revised to meet current compliance requirements. Dr. Ulyess Mort Lin Landsman MD, MD 09/07/2020 11:32:31 AM This report has been signed electronically. Number of Addenda: 0 Note Initiated On: 09/07/2020 10:38 AM Scope Withdrawal Time: 0 hours 24 minutes 16 seconds  Total Procedure Duration: 0 hours 26 minutes 49 seconds  Estimated Blood Loss:  Estimated blood loss: none.      [facility]

## 2020-09-07 NOTE — Anesthesia Postprocedure Evaluation (Signed)
Anesthesia Post Note  Patient: Amber Mills  Procedure(s) Performed: COLONOSCOPY WITH PROPOFOL (N/A )  Patient location during evaluation: Endoscopy Anesthesia Type: General Level of consciousness: awake and alert Pain management: pain level controlled Vital Signs Assessment: post-procedure vital signs reviewed and stable Respiratory status: spontaneous breathing, nonlabored ventilation and respiratory function stable Cardiovascular status: blood pressure returned to baseline and stable Postop Assessment: no apparent nausea or vomiting Anesthetic complications: no   No complications documented.   Last Vitals:  Vitals:   09/07/20 1009 09/07/20 1132  BP: (!) 162/79 (!) 99/49  Pulse: 93   Resp: 18   Temp: (!) 36.4 C (!) 36.2 C  SpO2: 100%     Last Pain:  Vitals:   09/07/20 1212  TempSrc:   PainSc: 0-No pain                 Alphonsus Sias

## 2020-09-07 NOTE — H&P (Signed)
Cephas Darby, MD [address]  [place]  [place],  [address]  Main: [phone]  Fax: [phone] Pager: [phone]  Primary Care Physician:  Idelle Crouch, MD Primary Gastroenterologist:  Dr. Cephas Darby  Pre-Procedure History & Physical: HPI:  Amber Mills is a 59 y.o. female is here for an colonoscopy.   Past Medical History:  Diagnosis Date  . Bipolar affective disorder ([place])   . GERD (gastroesophageal reflux disease)   . Hyperlipemia   . Hypertension   . Myocardial infarction ([place])   . Panic attack     Past Surgical History:  Procedure Laterality Date  . ABDOMINAL HYSTERECTOMY    . bladder mesh    . CARDIAC CATHETERIZATION    . CORONARY STENT INTERVENTION N/A 07/17/2017   Procedure: Coronary Stent Intervention;  Surgeon: Yolonda Kida, MD;  Location: [facility];  Service: Cardiovascular;  Laterality: N/A;  . HERNIA REPAIR    . LEFT HEART CATH AND CORONARY ANGIOGRAPHY N/A 07/17/2017   Procedure: Left Heart Cath and Coronary Angiography and possible pci;  Surgeon: Yolonda Kida, MD;  Location: [facility];  Service: Cardiovascular;  Laterality: N/A;  . TONSILLECTOMY      Prior to Admission medications   Medication Sig Start Date End Date Taking? Authorizing Provider  amitriptyline (ELAVIL) 25 MG tablet  01/15/19   [provider]  atorvastatin (LIPITOR) 80 MG tablet Take 1 tablet (80 mg total) by mouth daily at 6 PM. 07/18/17   Dustin Flock, MD  carvedilol (COREG) 3.125 MG tablet Take 1 tablet (3.125 mg total) by mouth 2 (two) times daily. 07/18/17 07/18/18  Dustin Flock, MD  clonazePAM (KLONOPIN) 0.5 MG tablet Take 0.25 mg by mouth 2 (two) times daily.    [provider]  clopidogrel (PLAVIX) 75 MG tablet TAKE 1 TABLET BY MOUTH EVERY DAY Patient not taking: Reported on 07/13/2020 05/18/18   [provider]  dicyclomine (BENTYL) 10 MG capsule TAKE 1 CAPSULE (10 MG TOTAL) BY MOUTH 3  (THREE) TIMES DAILY AS NEEDED FOR UP TO 30 DOSES FOR SPASMS. Patient not taking: Reported on 07/13/2020 04/08/19   Lin Landsman, MD  diltiazem (CARDIZEM CD) 120 MG 24 hr capsule Take by mouth. 07/01/18 07/01/19  [provider]  divalproex (DEPAKOTE ER) 250 MG 24 hr tablet Take 250 mg by mouth daily. Patient not taking: Reported on 07/13/2020    [provider]  divalproex (DEPAKOTE) 250 MG DR tablet Take 250 mg by mouth 2 (two) times daily. Patient not taking: Reported on 07/13/2020 01/01/19   [provider]  levocetirizine (XYZAL) 5 MG tablet Take by mouth. 01/01/19 01/01/20  [provider]  levocetirizine (XYZAL) 5 MG tablet every evening. Patient not taking: Reported on 07/13/2020 01/01/19   [provider]  olmesartan (BENICAR) 20 MG tablet Take 1 tablet (20 mg total) by mouth daily. 07/18/17   Dustin Flock, MD  olmesartan (BENICAR) 40 MG tablet Take 40 mg by mouth daily. Patient not taking: Reported on 07/13/2020 11/07/18   [provider]  pantoprazole (PROTONIX) 40 MG tablet Take 1 tablet (40 mg total) by mouth 2 (two) times daily. 07/18/17   Dustin Flock, MD  pantoprazole (PROTONIX) 40 MG tablet Take by mouth. Patient not taking: Reported on 07/13/2020 07/18/17   [provider]  ranitidine (ZANTAC) 300 MG tablet Take 300 mg by mouth daily. Patient not taking: Reported on 07/13/2020    [provider]  ticagrelor (  BRILINTA) 90 MG TABS tablet Take 1 tablet (90 mg total) by mouth 2 (two) times daily. Patient not taking: Reported on 02/23/2019 07/18/17   Dustin Flock, MD    Allergies as of 08/22/2020 - Review Complete 07/13/2020  Allergen Reaction Noted  . Other Anaphylaxis 07/16/2017  . Zofran [ondansetron hcl] Itching 07/17/2017  . Buspirone  10/21/2018  . Haloperidol  10/21/2018  . Sertraline  10/21/2018  . Penicillins Rash 07/16/2017    Family History  Problem Relation Age of Onset  . CAD Father   . Breast  cancer Maternal Aunt   . Breast cancer Paternal Aunt     Social History   Socioeconomic History  . Marital status: Widowed    Spouse name: Not on file  . Number of children: Not on file  . Years of education: Not on file  . Highest education level: Not on file  Occupational History  . Not on file  Tobacco Use  . Smoking status: Former Research scientist (life sciences)  . Smokeless tobacco: Never Used  Vaping Use  . Vaping Use: Never used  Substance and Sexual Activity  . Alcohol use: No  . Drug use: No  . Sexual activity: Never  Other Topics Concern  . Not on file  Social History Narrative  . Not on file   Social Determinants of Health   Financial Resource Strain:   . Difficulty of Paying Living Expenses: Not on file  Food Insecurity:   . Worried About Charity fundraiser in the Last Year: Not on file  . Ran Out of Food in the Last Year: Not on file  Transportation Needs:   . Lack of Transportation (Medical): Not on file  . Lack of Transportation (Non-Medical): Not on file  Physical Activity:   . Days of Exercise per Week: Not on file  . Minutes of Exercise per Session: Not on file  Stress:   . Feeling of Stress : Not on file  Social Connections:   . Frequency of Communication with Friends and Family: Not on file  . Frequency of Social Gatherings with Friends and Family: Not on file  . Attends Religious Services: Not on file  . Active Member of Clubs or Organizations: Not on file  . Attends Archivist Meetings: Not on file  . Marital Status: Not on file  Intimate Partner Violence:   . Fear of Current or Ex-Partner: Not on file  . Emotionally Abused: Not on file  . Physically Abused: Not on file  . Sexually Abused: Not on file    Review of Systems: See HPI, otherwise negative ROS  Physical Exam: BP (!) 162/79   Pulse 93   Temp (!) 97.5 F (36.4 C) (Temporal)   Resp 18   Ht 5\' 2"  (1.575 m)   Wt 88.9 kg   SpO2 100%   BMI 35.85 kg/m  General:   Alert,  pleasant and  cooperative in NAD Head:  Normocephalic and atraumatic. Neck:  Supple; no masses or thyromegaly. Lungs:  Clear throughout to auscultation.    Heart:  Regular rate and rhythm. Abdomen:  Soft, nontender and nondistended. Normal bowel sounds, without guarding, and without rebound.   Neurologic:  Alert and  oriented x4;  grossly normal neurologically.  Impression/Plan: Amber Mills is here for an colonoscopy to be performed for h/o colon polyps  Risks, benefits, limitations, and alternatives regarding  colonoscopy have been reviewed with the patient.  Questions have been answered.  All parties agreeable.  Sherri Sear, MD  09/07/2020, 10:39 AM

## 2020-09-07 NOTE — Anesthesia Preprocedure Evaluation (Signed)
Anesthesia Evaluation  Patient identified by MRN, date of birth, ID band Patient awake    Reviewed: Allergy & Precautions, H&P , NPO status , reviewed documented beta blocker date and time   Airway Mallampati: II  TM Distance: >3 FB Neck ROM: full    Dental  (+) Edentulous Upper, Edentulous Lower   Pulmonary former smoker,    Pulmonary exam normal        Cardiovascular hypertension, (-) angina+ CAD, + Past MI, + Cardiac Stents and + DOE  Normal cardiovascular exam  2018 Cath/Stent  There is mild left ventricular systolic dysfunction.  The left ventricular ejection fraction is 50-55% by visual estimate.  A STENT XIENCE ALPINE RX [id number] drug eluting stent was successfully placed, and does not overlap previously placed stent.  Prox LAD to Mid LAD lesion, 99 %stenosed.  Post intervention, there is a 0% residual stenosis.  Pt stable CAD, able to walk 100 yds, climb flight of stairs, mild DOE otherwise sx free   Neuro/Psych PSYCHIATRIC DISORDERS Anxiety Bipolar Disorder    GI/Hepatic GERD  Controlled and Medicated,  Endo/Other    Renal/GU      Musculoskeletal   Abdominal   Peds  Hematology   Anesthesia Other Findings Past Medical History: No date: Bipolar affective disorder ([place]) No date: GERD (gastroesophageal reflux disease) No date: Hypertension No date: Panic attack  Past Surgical History: No date: ABDOMINAL HYSTERECTOMY No date: bladder mesh 07/17/2017: CORONARY STENT INTERVENTION; N/A     Comment:  Procedure: Coronary Stent Intervention;  Surgeon:               Yolonda Kida, MD;  Location: [facility];               Service: Cardiovascular;  Laterality: N/A; No date: HERNIA REPAIR 07/17/2017: LEFT HEART CATH AND CORONARY ANGIOGRAPHY; N/A     Comment:  Procedure: Left Heart Cath and Coronary Angiography and               possible pci;  Surgeon: Yolonda Kida, MD;                 Location: [facility];  Service: Cardiovascular;              Laterality: N/A; No date: TONSILLECTOMY  BMI    Body Mass Index: 35.85 kg/m      Reproductive/Obstetrics                             Anesthesia Physical Anesthesia Plan  ASA: III  Anesthesia Plan: General   Post-op Pain Management:    Induction: Intravenous  PONV Risk Score and Plan: Treatment may vary due to age or medical condition and TIVA  Airway Management Planned: Nasal Cannula and Natural Airway  Additional Equipment:   Intra-op Plan:   Post-operative Plan:   Informed Consent: I have reviewed the patients History and Physical, chart, labs and discussed the procedure including the risks, benefits and alternatives for the proposed anesthesia with the patient or authorized representative who has indicated his/her understanding and acceptance.     Dental Advisory Given  Plan Discussed with: CRNA  Anesthesia Plan Comments:         Anesthesia Quick Evaluation

## 2020-09-08 ENCOUNTER — Encounter: Payer: Self-pay | Admitting: Gastroenterology

## 2020-09-09 ENCOUNTER — Other Ambulatory Visit: Payer: Self-pay | Admitting: Gastroenterology

## 2020-09-11 LAB — SURGICAL PATHOLOGY

## 2020-09-12 ENCOUNTER — Encounter: Payer: Self-pay | Admitting: Gastroenterology

## 2021-05-01 ENCOUNTER — Ambulatory Visit: Payer: Medicare Other | Admitting: Gastroenterology

## 2021-05-01 ENCOUNTER — Encounter: Payer: Self-pay | Admitting: Gastroenterology

## 2021-05-01 ENCOUNTER — Other Ambulatory Visit: Payer: Self-pay

## 2021-06-18 ENCOUNTER — Other Ambulatory Visit: Payer: Self-pay

## 2021-06-18 ENCOUNTER — Encounter: Payer: Self-pay | Admitting: Emergency Medicine

## 2021-06-18 ENCOUNTER — Emergency Department
Admission: EM | Admit: 2021-06-18 | Discharge: 2021-06-18 | Disposition: A | Discharge status: Home / Self Care | Payer: Medicare Other | Attending: Emergency Medicine | Admitting: Emergency Medicine

## 2021-06-18 DIAGNOSIS — Z955 Presence of coronary angioplasty implant and graft: Secondary | ICD-10-CM | POA: Insufficient documentation

## 2021-06-18 DIAGNOSIS — Z87891 Personal history of nicotine dependence: Secondary | ICD-10-CM | POA: Insufficient documentation

## 2021-06-18 DIAGNOSIS — I10 Essential (primary) hypertension: Secondary | ICD-10-CM | POA: Insufficient documentation

## 2021-06-18 DIAGNOSIS — Z7982 Long term (current) use of aspirin: Secondary | ICD-10-CM | POA: Diagnosis not present

## 2021-06-18 DIAGNOSIS — I25118 Atherosclerotic heart disease of native coronary artery with other forms of angina pectoris: Secondary | ICD-10-CM | POA: Insufficient documentation

## 2021-06-18 DIAGNOSIS — F419 Anxiety disorder, unspecified: Secondary | ICD-10-CM

## 2021-06-18 DIAGNOSIS — Z79899 Other long term (current) drug therapy: Secondary | ICD-10-CM | POA: Insufficient documentation

## 2021-06-18 DIAGNOSIS — R519 Headache, unspecified: Secondary | ICD-10-CM | POA: Diagnosis present

## 2021-06-18 LAB — CBC
HCT: 42.7 % (ref 36.0–46.0)
Hemoglobin: 15.1 g/dL — ABNORMAL HIGH (ref 12.0–15.0)
MCH: 29.8 pg (ref 26.0–34.0)
MCHC: 35.4 g/dL (ref 30.0–36.0)
MCV: 84.2 fL (ref 80.0–100.0)
Platelets: 355 10*3/uL (ref 150–400)
RBC: 5.07 MIL/uL (ref 3.87–5.11)
RDW: 12.3 % (ref 11.5–15.5)
WBC: 9.5 10*3/uL (ref 4.0–10.5)
nRBC: 0 % (ref 0.0–0.2)

## 2021-06-18 LAB — BASIC METABOLIC PANEL
Anion gap: 10 (ref 5–15)
BUN: 12 mg/dL (ref 8–23)
CO2: 22 mmol/L (ref 22–32)
Calcium: 9.7 mg/dL (ref 8.9–10.3)
Chloride: 108 mmol/L (ref 98–111)
Creatinine, Ser: 0.99 mg/dL (ref 0.44–1.00)
GFR, Estimated: >60 mL/min (ref >60)
Glucose, Bld: 145 mg/dL — ABNORMAL HIGH (ref 70–99)
Potassium: 3.6 mmol/L (ref 3.5–5.1)
Sodium: 140 mmol/L (ref 135–145)

## 2021-06-18 LAB — TROPONIN I (HIGH SENSITIVITY): Troponin I (High Sensitivity): 5 ng/L (ref <18)

## 2021-06-18 NOTE — ED Triage Notes (Signed)
Pt comes into the ED via POV c/o HTN.  Pt states she has had a headache and dizziness since the pressure has been elevated.  PT has h/o MI in the past.  Pt currently taking BP medications with no relief.  Pt states she took her normal BP medication and another half tablet.  Pt ambulatory to triage at this time and in NAD with even and unlabored respirations.

## 2021-06-18 NOTE — ED Provider Notes (Signed)
[facility] Emergency Department Provider Note   ____________________________________________   Event Date/Time   First MD Initiated Contact with Patient 06/18/21 1653     (approximate)  I have reviewed the triage vital signs and the nursing notes.   HISTORY  Chief Complaint Hypertension    HPI Amber Mills is a 60 y.o. female with below stated past medical history presents for hypertension, palpitations, and anxiety.  Patient is concerned as she states over the past 24 hours she has been having worsening hypertension with associated throbbing global headache.  Patient denies any recent medication changes but does endorse worsening stress caused by social situations.  Patient denies being adherent to her bipolar medication.  Patient currently denies any vision changes, tinnitus, difficulty speaking, facial droop, sore throat, chest pain, shortness of breath, abdominal pain, nausea/vomiting/diarrhea, dysuria, or weakness/numbness/paresthesias in any extremity         Past Medical History:  Diagnosis Date   Bipolar affective disorder ([place])    GERD (gastroesophageal reflux disease)    Hyperlipemia    Hypertension    Myocardial infarction [facility])    Panic attack     Patient Active Problem List   Diagnosis Date Noted   Coronary artery disease of native artery of native heart with stable angina pectoris ([place]) 07/05/2019   Anxiety 02/23/2019   Hyperlipidemia 02/23/2019   Hypertension 02/23/2019   Long term current use of anticoagulant 09/10/2018   Hx of colonic polyps 08/01/2017   NSTEMI (non-ST elevated myocardial infarction) ([place]) 07/16/2017    Past Surgical History:  Procedure Laterality Date   ABDOMINAL HYSTERECTOMY     bladder mesh     CARDIAC CATHETERIZATION     COLONOSCOPY WITH PROPOFOL N/A 09/07/2020   Procedure: COLONOSCOPY WITH PROPOFOL;  Surgeon: Lin Landsman, MD;  Location: [facility];  Service: Gastroenterology;   Laterality: N/A;   CORONARY STENT INTERVENTION N/A 07/17/2017   Procedure: Coronary Stent Intervention;  Surgeon: Yolonda Kida, MD;  Location: [facility];  Service: Cardiovascular;  Laterality: N/A;   HERNIA REPAIR     LEFT HEART CATH AND CORONARY ANGIOGRAPHY N/A 07/17/2017   Procedure: Left Heart Cath and Coronary Angiography and possible pci;  Surgeon: Yolonda Kida, MD;  Location: [facility];  Service: Cardiovascular;  Laterality: N/A;   TONSILLECTOMY      Prior to Admission medications   Medication Sig Start Date End Date Taking? Authorizing Provider  aspirin 81 MG chewable tablet Chew by mouth daily.    [provider]  atorvastatin (LIPITOR) 80 MG tablet Take 1 tablet (80 mg total) by mouth daily at 6 PM. 07/18/17   Dustin Flock, MD  carvedilol (COREG) 3.125 MG tablet Take 1 tablet (3.125 mg total) by mouth 2 (two) times daily. 07/18/17 07/18/18  Dustin Flock, MD  clonazePAM (KLONOPIN) 0.5 MG tablet Take 0.25 mg by mouth 2 (two) times daily.    [provider]  clotrimazole-betamethasone (LOTRISONE) cream APPLY TO AFFECTED AREA TWICE A DAY 11/10/20   [provider]  diltiazem (CARDIZEM CD) 120 MG 24 hr capsule Take by mouth. 07/01/18 07/01/19  [provider]  divalproex (DEPAKOTE ER) 250 MG 24 hr tablet Take 250 mg by mouth daily. Patient not taking: Reported on 07/13/2020    [provider]  levocetirizine (XYZAL) 5 MG tablet Take by mouth. 01/01/19 01/01/20  [provider]  meloxicam (MOBIC) 15 MG tablet Take 1 tablet by mouth daily. 11/22/20   [provider]  methocarbamol (ROBAXIN) 500 MG tablet Take 1 tablet by mouth 3 (three) times daily as needed. 12/26/20   [provider]  olmesartan (BENICAR) 40 MG tablet Take 40 mg by mouth daily. 04/23/21   [provider]  pantoprazole (PROTONIX) 40 MG tablet Take 1 tablet (40 mg total) by mouth 2 (two) times daily. 07/18/17   Dustin Flock, MD  tiZANidine (ZANAFLEX) 4 MG tablet Take 4 mg by mouth every 8 (eight) hours as needed. 01/23/21   [provider]    Allergies Other, Zofran [ondansetron hcl], Buspirone, Haloperidol, Sertraline, and Penicillins  Family History  Problem Relation Age of Onset   CAD Father    Breast cancer Maternal Aunt    Breast cancer Paternal Aunt     Social History Social History   Tobacco Use   Smoking status: Former    Pack years: 0.00   Smokeless tobacco: Never  Vaping Use   Vaping Use: Never used  Substance Use Topics   Alcohol use: No   Drug use: No    Review of Systems Constitutional: No fever/chills Eyes: No visual changes. ENT: No sore throat. Cardiovascular: Denies chest pain. Respiratory: Denies shortness of breath. Gastrointestinal: No abdominal pain.  No nausea, no vomiting.  No diarrhea. Genitourinary: Negative for dysuria. Musculoskeletal: Negative for acute arthralgias Skin: Negative for rash. Neurological: Positive for headaches, negative for weakness/numbness/paresthesias in any extremity Psychiatric: Negative for suicidal ideation/homicidal ideation   ____________________________________________   PHYSICAL EXAM:  VITAL SIGNS: ED Triage Vitals  Enc Vitals Group     BP 06/18/21 1603 (!) 151/93     Pulse Rate 06/18/21 1603 (!) 133     Resp 06/18/21 1603 20     Temp 06/18/21 1603 98.6 F (37 C)     Temp Source 06/18/21 1603 Oral     SpO2 06/18/21 1603 97 %     Weight 06/18/21 1604 200 lb (90.7 kg)     Height 06/18/21 1604 5\' 1"  (1.549 m)     Head Circumference --      Peak Flow --      Pain Score 06/18/21 1604 0     Pain Loc --      Pain Edu? --      Excl. in [place]? --    Constitutional: Alert and oriented. Well appearing and in no acute distress. Eyes: Conjunctivae are normal. PERRL. Head: Atraumatic. Nose: No congestion/rhinnorhea. Mouth/Throat: Mucous membranes are moist. Neck: No stridor Cardiovascular: Grossly normal heart  sounds.  Good peripheral circulation. Respiratory: Normal respiratory effort.  No retractions. Gastrointestinal: Soft and nontender. No distention. Musculoskeletal: No obvious deformities Neurologic:  Normal speech and language. No gross focal neurologic deficits are appreciated. Skin:  Skin is warm and dry. No rash noted. Psychiatric: Mood and affect are normal. Speech and behavior are normal.  ____________________________________________   LABS (all labs ordered are listed, but only abnormal results are displayed)  Labs Reviewed  CBC - Abnormal; Notable for the following components:      Result Value   Hemoglobin 15.1 (*)    All other components within normal limits  BASIC METABOLIC PANEL - Abnormal; Notable for the following components:   Glucose, Bld 145 (*)    All other components within normal limits  TROPONIN I (HIGH SENSITIVITY)  TROPONIN I (HIGH SENSITIVITY)   ____________________________________________  EKG  ED ECG REPORT I, Naaman Plummer, the attending physician, personally viewed and interpreted this ECG.  Date: 06/18/2021 EKG Time: 1606 Rate: 129  Rhythm: Tachycardic sinus rhythm QRS Axis: normal Intervals: normal ST/T Wave abnormalities: normal Narrative Interpretation: no evidence of acute ischemia  PROCEDURES  Procedure(s) performed (including Critical Care):  .1-3 Lead EKG Interpretation  Date/Time: 06/18/2021 7:17 PM Performed by: Naaman Plummer, MD Authorized by: Naaman Plummer, MD     Interpretation: normal     ECG rate:  84   ECG rate assessment: normal     Rhythm: sinus rhythm     Ectopy: none     Conduction: normal     ____________________________________________   INITIAL IMPRESSION / ASSESSMENT AND PLAN / ED COURSE  As part of my medical decision making, I reviewed the following data within the [place] notes reviewed and incorporated, Labs reviewed, EKG interpreted, Old chart reviewed, Radiograph  reviewed and Notes from prior ED visits reviewed and incorporated        Presents to the emergency department complaining of high blood pressure. Patient is otherwise asymptomatic without confusion, chest pain, hematuria, or SOB. Denies nonadherence to antihypertensive regimen DDx: CV, AMI, heart failure, renal infarction or failure or other end organ damage.  Disposition: Discussed with patient their elevated blood pressure and need for close outpatient management of their hypertension. Will provide a prescription for the patients previous antihypertensive medication and arrange for the patient to follow up in a primary care clinic      ____________________________________________   FINAL CLINICAL IMPRESSION(S) / ED DIAGNOSES  Final diagnoses:  Primary hypertension  Anxiety     ED Discharge Orders     None        Note:  This document was prepared using Dragon voice recognition software and may include unintentional dictation errors.    Naaman Plummer, MD 06/18/21 [phone]

## 2021-07-18 ENCOUNTER — Other Ambulatory Visit: Payer: Self-pay | Admitting: Internal Medicine

## 2021-07-18 DIAGNOSIS — Z1231 Encounter for screening mammogram for malignant neoplasm of breast: Secondary | ICD-10-CM

## 2021-07-26 ENCOUNTER — Other Ambulatory Visit: Payer: Self-pay | Admitting: Internal Medicine

## 2021-07-26 ENCOUNTER — Other Ambulatory Visit (HOSPITAL_COMMUNITY): Payer: Self-pay | Admitting: Internal Medicine

## 2021-07-26 DIAGNOSIS — R1031 Right lower quadrant pain: Secondary | ICD-10-CM

## 2021-07-26 DIAGNOSIS — R1032 Left lower quadrant pain: Secondary | ICD-10-CM

## 2021-07-30 ENCOUNTER — Emergency Department
Admission: EM | Admit: 2021-07-30 | Discharge: 2021-07-30 | Disposition: A | Discharge status: Home / Self Care | Payer: Medicare Other | Attending: Emergency Medicine | Admitting: Emergency Medicine

## 2021-07-30 ENCOUNTER — Emergency Department: Payer: Medicare Other

## 2021-07-30 ENCOUNTER — Other Ambulatory Visit: Payer: Self-pay

## 2021-07-30 DIAGNOSIS — Z7901 Long term (current) use of anticoagulants: Secondary | ICD-10-CM | POA: Insufficient documentation

## 2021-07-30 DIAGNOSIS — Z7982 Long term (current) use of aspirin: Secondary | ICD-10-CM | POA: Diagnosis not present

## 2021-07-30 DIAGNOSIS — Z79899 Other long term (current) drug therapy: Secondary | ICD-10-CM | POA: Insufficient documentation

## 2021-07-30 DIAGNOSIS — Z87891 Personal history of nicotine dependence: Secondary | ICD-10-CM | POA: Diagnosis not present

## 2021-07-30 DIAGNOSIS — I25118 Atherosclerotic heart disease of native coronary artery with other forms of angina pectoris: Secondary | ICD-10-CM | POA: Diagnosis not present

## 2021-07-30 DIAGNOSIS — I10 Essential (primary) hypertension: Secondary | ICD-10-CM | POA: Insufficient documentation

## 2021-07-30 DIAGNOSIS — R079 Chest pain, unspecified: Secondary | ICD-10-CM | POA: Insufficient documentation

## 2021-07-30 LAB — BASIC METABOLIC PANEL
Anion gap: 12 (ref 5–15)
BUN: 12 mg/dL (ref 8–23)
CO2: 21 mmol/L — ABNORMAL LOW (ref 22–32)
Calcium: 9.3 mg/dL (ref 8.9–10.3)
Chloride: 108 mmol/L (ref 98–111)
Creatinine, Ser: 0.95 mg/dL (ref 0.44–1.00)
GFR, Estimated: >60 mL/min (ref >60)
Glucose, Bld: 120 mg/dL — ABNORMAL HIGH (ref 70–99)
Potassium: 3.7 mmol/L (ref 3.5–5.1)
Sodium: 141 mmol/L (ref 135–145)

## 2021-07-30 LAB — TROPONIN I (HIGH SENSITIVITY)
Troponin I (High Sensitivity): 6 ng/L (ref <18)
Troponin I (High Sensitivity): 7 ng/L (ref <18)

## 2021-07-30 LAB — CBC
HCT: 42.9 % (ref 36.0–46.0)
Hemoglobin: 15 g/dL (ref 12.0–15.0)
MCH: 30.1 pg (ref 26.0–34.0)
MCHC: 35 g/dL (ref 30.0–36.0)
MCV: 86.1 fL (ref 80.0–100.0)
Platelets: 336 10*3/uL (ref 150–400)
RBC: 4.98 MIL/uL (ref 3.87–5.11)
RDW: 12.4 % (ref 11.5–15.5)
WBC: 5.9 10*3/uL (ref 4.0–10.5)
nRBC: 0 % (ref 0.0–0.2)

## 2021-07-30 LAB — URINALYSIS, COMPLETE (UACMP) WITH MICROSCOPIC
Bacteria, UA: NONE SEEN
Bilirubin Urine: NEGATIVE
Glucose, UA: NEGATIVE mg/dL
Ketones, ur: NEGATIVE mg/dL
Leukocytes,Ua: NEGATIVE
Nitrite: NEGATIVE
Protein, ur: NEGATIVE mg/dL
Specific Gravity, Urine: 1.002 — ABNORMAL LOW (ref 1.005–1.030)
Squamous Epithelial / HPF: NONE SEEN (ref 0–5)
pH: 6 (ref 5.0–8.0)

## 2021-07-30 NOTE — ED Triage Notes (Signed)
Pt to ER via POV with complaints of chest pain that started this morning, patient reports that pain has now subsided but at the time she was walking and had three sharp shooting pains across her chest. Reports shortness of breath at the time. Reports taking her blood pressure medication and x2 baby aspirin PTA. Hx of MI. Hx of bipolar, reports increased anxiety at this time.  Pt also states she is currently taking abx for UTI.

## 2021-07-30 NOTE — ED Provider Notes (Signed)
[facility] Emergency Department Provider Note  Time seen: 11:54 AM  I have reviewed the triage vital signs and the nursing notes.   HISTORY  Chief Complaint Chest Pain   HPI Amber Mills is a 60 y.o. female with a past medical history of bipolar, gastric reflux, hypertension, hyperlipidemia, MI, presents to the emergency department for chest pain.  According to the patient she was walking around [place] when she developed a sharp pain in her chest that lasted approximately 1 second and then went away, states over the next several minutes this happened 2 additional times for a total of 3 sharp pains to the center of the chest.  Patient states she has been belching this morning and denies any pain over the past 2 hours or so.  Denies any nausea vomiting shortness of breath or diaphoresis.  Patient was concerned so she came to the emergency department for evaluation.  Patient remains chest pain-free and well-appearing.   Past Medical History:  Diagnosis Date   Bipolar affective disorder ([place])    GERD (gastroesophageal reflux disease)    Hyperlipemia    Hypertension    Myocardial infarction [facility])    Panic attack     Patient Active Problem List   Diagnosis Date Noted   Coronary artery disease of native artery of native heart with stable angina pectoris ([place]) 07/05/2019   Anxiety 02/23/2019   Hyperlipidemia 02/23/2019   Hypertension 02/23/2019   Long term current use of anticoagulant 09/10/2018   Hx of colonic polyps 08/01/2017   NSTEMI (non-ST elevated myocardial infarction) ([place]) 07/16/2017    Past Surgical History:  Procedure Laterality Date   ABDOMINAL HYSTERECTOMY     bladder mesh     CARDIAC CATHETERIZATION     COLONOSCOPY WITH PROPOFOL N/A 09/07/2020   Procedure: COLONOSCOPY WITH PROPOFOL;  Surgeon: Lin Landsman, MD;  Location: [place];  Service: Gastroenterology;  Laterality: N/A;   CORONARY STENT INTERVENTION N/A 07/17/2017    Procedure: Coronary Stent Intervention;  Surgeon: Yolonda Kida, MD;  Location: [place] [facility];  Service: Cardiovascular;  Laterality: N/A;   HERNIA REPAIR     LEFT HEART CATH AND CORONARY ANGIOGRAPHY N/A 07/17/2017   Procedure: Left Heart Cath and Coronary Angiography and possible pci;  Surgeon: Yolonda Kida, MD;  Location: [place] [facility];  Service: Cardiovascular;  Laterality: N/A;   TONSILLECTOMY      Prior to Admission medications   Medication Sig Start Date End Date Taking? Authorizing Provider  aspirin 81 MG chewable tablet Chew by mouth daily.    [provider]  atorvastatin (LIPITOR) 80 MG tablet Take 1 tablet (80 mg total) by mouth daily at 6 PM. 07/18/17   Dustin Flock, MD  carvedilol (COREG) 3.125 MG tablet Take 1 tablet (3.125 mg total) by mouth 2 (two) times daily. 07/18/17 07/18/18  Dustin Flock, MD  clonazePAM (KLONOPIN) 0.5 MG tablet Take 0.25 mg by mouth 2 (two) times daily.    [provider]  clotrimazole-betamethasone (LOTRISONE) cream APPLY TO AFFECTED AREA TWICE A DAY 11/10/20   [provider]  diltiazem (CARDIZEM CD) 120 MG 24 hr capsule Take by mouth. 07/01/18 07/01/19  [provider]  divalproex (DEPAKOTE ER) 250 MG 24 hr tablet Take 250 mg by mouth daily. Patient not taking: Reported on 07/13/2020    [provider]  levocetirizine (XYZAL) 5 MG tablet Take by mouth. 01/01/19 01/01/20  [provider]  meloxicam (MOBIC) 15 MG tablet Take  1 tablet by mouth daily. 11/22/20   [provider]  methocarbamol (ROBAXIN) 500 MG tablet Take 1 tablet by mouth 3 (three) times daily as needed. 12/26/20   [provider]  olmesartan (BENICAR) 40 MG tablet Take 40 mg by mouth daily. 04/23/21   [provider]  pantoprazole (PROTONIX) 40 MG tablet Take 1 tablet (40 mg total) by mouth 2 (two) times daily. 07/18/17   Dustin Flock, MD  tiZANidine (ZANAFLEX) 4 MG tablet Take 4 mg by mouth  every 8 (eight) hours as needed. 01/23/21   [provider]    Allergies  Allergen Reactions   Other Anaphylaxis    Ex lax   Zofran [Ondansetron Hcl] Itching   Buspirone    Ciprofloxacin Nausea And Vomiting   Haloperidol    Sertraline    Penicillins Rash    .Has patient had a PCN reaction causing immediate rash, facial/tongue/throat swelling, SOB or lightheadedness with hypotension: No Has patient had a PCN reaction causing severe rash involving mucus membranes or skin necrosis: No Has patient had a PCN reaction that required hospitalization: No Has patient had a PCN reaction occurring within the last 10 years: No If all of the above answers are "NO", then may proceed with Cephalosporin use.     Family History  Problem Relation Age of Onset   CAD Father    Breast cancer Maternal Aunt    Breast cancer Paternal Aunt     Social History Social History   Tobacco Use   Smoking status: Former   Smokeless tobacco: Never  Scientific laboratory technician Use: Never used  Substance Use Topics   Alcohol use: No   Drug use: No    Review of Systems Constitutional: Negative for fever. Cardiovascular: To be sharp chest pains occurred this morning lasting approximately 1 second separated by minutes. Respiratory: Negative for shortness of breath.  No pleuritic chest pain. Gastrointestinal: Negative for abdominal pain, vomiting and diarrhea.  Positive for belching. Genitourinary: States recently treated for urinary tract infection, but denies any dysuria. Musculoskeletal: Negative for musculoskeletal complaints.  No leg pain or swelling. Skin: Negative for skin complaints.  Negative diaphoresis. Neurological: Negative for headache All other ROS negative  ____________________________________________   PHYSICAL EXAM:  VITAL SIGNS: ED Triage Vitals  Enc Vitals Group     BP 07/30/21 1101 (!) 156/84     Pulse Rate 07/30/21 1101 97     Resp 07/30/21 1101 16     Temp 07/30/21 1101  98.6 F (37 C)     Temp Source 07/30/21 1101 Oral     SpO2 07/30/21 1101 97 %     Weight 07/30/21 1102 216 lb 0.8 oz (98 kg)     Height 07/30/21 1102 '5\' 1"'$  (1.549 m)     Head Circumference --      Peak Flow --      Pain Score 07/30/21 1102 0     Pain Loc --      Pain Edu? --      Excl. in [place]? --    Constitutional: Alert and oriented. Well appearing and in no distress. Eyes: Normal exam ENT      Head: Normocephalic and atraumatic.      Nose: No congestion/rhinnorhea.      Mouth/Throat: Mucous membranes are moist. Cardiovascular: Normal rate, regular rhythm. Respiratory: Normal respiratory effort without tachypnea nor retractions. Breath sounds are clear  Gastrointestinal: Soft and nontender. No distention.  Musculoskeletal: Nontender with normal range of  motion in all extremities. No lower extremity tenderness or edema. Neurologic:  Normal speech and language. No gross focal neurologic deficits Psychiatric: Mildly anxious.  ____________________________________________    EKG  EKG viewed and interpreted by myself shows sinus tachycardia 104 bpm with a narrow QRS, normal axis, normal intervals, nonspecific ST changes.  ____________________________________________    RADIOLOGY  Chest x-ray is negative.  ____________________________________________   INITIAL IMPRESSION / ASSESSMENT AND PLAN / ED COURSE  Pertinent labs & imaging results that were available during my care of the patient were reviewed by me and considered in my medical decision making (see chart for details).   Patient presents emergency department for chest pain that occurred around 9-10 o'clock this morning.  Describes as 3 sharp chest pains that lasted a second at most each and then went away.  Patient states she belched several times this morning has not had pain since.  Denies any pain currently.  Denies any shortness of breath nausea or diaphoresis at any point.  Patient's EKG shows mild tachycardia but  otherwise reassuring.  No ST changes.  Chest x-ray is negative.  Lab work is pending including troponin which we will check x2 as a precaution.  Patient agreeable to plan.  No pleuritic pain leg pain or swelling.  Patient's work-up is reassuring.  Negative troponin x2.  Given the patient's reassuring work-up we will discharge the patient home with PCP follow-up.  Patient very reassured.  Amber Mills was evaluated in Emergency Department on 07/30/2021 for the symptoms described in the history of present illness. She was evaluated in the context of the global COVID-19 pandemic, which necessitated consideration that the patient might be at risk for infection with the SARS-CoV-2 virus that causes COVID-19. Institutional protocols and algorithms that pertain to the evaluation of patients at risk for COVID-19 are in a state of rapid change based on information released by regulatory bodies including the CDC and federal and state organizations. These policies and algorithms were followed during the patient's care in the ED.  ____________________________________________   FINAL CLINICAL IMPRESSION(S) / ED DIAGNOSES  Chest pain   Harvest Dark, MD 07/30/21 1448

## 2021-08-10 ENCOUNTER — Other Ambulatory Visit: Payer: Self-pay

## 2021-08-10 ENCOUNTER — Ambulatory Visit
Admission: RE | Admit: 2021-08-10 | Discharge: 2021-08-10 | Disposition: A | Discharge status: Home / Self Care | Payer: Medicare Other | Source: Ambulatory Visit | Attending: Internal Medicine | Admitting: Internal Medicine

## 2021-08-10 DIAGNOSIS — R1031 Right lower quadrant pain: Secondary | ICD-10-CM | POA: Insufficient documentation

## 2021-08-10 DIAGNOSIS — R1032 Left lower quadrant pain: Secondary | ICD-10-CM | POA: Diagnosis present

## 2021-08-10 MED ORDER — IOHEXOL 350 MG/ML SOLN
100.0000 mL | Freq: Once | INTRAVENOUS | Status: AC | PRN
  Administered 2021-08-10: 100 mL via INTRAVENOUS

## 2022-03-22 ENCOUNTER — Other Ambulatory Visit: Payer: Self-pay

## 2022-03-22 ENCOUNTER — Ambulatory Visit
Admission: RE | Admit: 2022-03-22 | Discharge: 2022-03-22 | Disposition: A | Discharge status: Home / Self Care | Payer: Medicare Other | Source: Ambulatory Visit | Attending: Internal Medicine | Admitting: Internal Medicine

## 2022-03-22 DIAGNOSIS — Z1231 Encounter for screening mammogram for malignant neoplasm of breast: Secondary | ICD-10-CM | POA: Diagnosis present

## 2022-11-12 DIAGNOSIS — C4491 Basal cell carcinoma of skin, unspecified: Secondary | ICD-10-CM

## 2022-11-12 HISTORY — DX: Basal cell carcinoma of skin, unspecified: C44.91

## 2023-05-12 ENCOUNTER — Telehealth: Payer: Self-pay

## 2023-05-12 ENCOUNTER — Other Ambulatory Visit: Payer: Self-pay

## 2023-05-12 DIAGNOSIS — Z1231 Encounter for screening mammogram for malignant neoplasm of breast: Secondary | ICD-10-CM

## 2023-05-12 NOTE — Telephone Encounter (Signed)
Patient is due for her repeat colonoscopy 09/08/23 due to history of colon polyps with Dr. Allegra Lai.  Office visit has been scheduled for patient to see Dr. Allegra Lai in office on 08/27/23 due to patient experiencing tightness discomfort around her stomach.  Describes it as feeling like a band around her stomach.  She is also experiencing a lot of burping, and pain located around her right rib cage.  Thanks,  [place], [place]

## 2023-07-31 ENCOUNTER — Encounter: Payer: Self-pay | Admitting: Dermatology

## 2023-07-31 ENCOUNTER — Ambulatory Visit (INDEPENDENT_AMBULATORY_CARE_PROVIDER_SITE_OTHER): Payer: 59 | Admitting: Dermatology

## 2023-07-31 VITALS — BP 148/75 | HR 96

## 2023-07-31 DIAGNOSIS — Z7189 Other specified counseling: Secondary | ICD-10-CM

## 2023-07-31 DIAGNOSIS — D229 Melanocytic nevi, unspecified: Secondary | ICD-10-CM

## 2023-07-31 DIAGNOSIS — L300 Nummular dermatitis: Secondary | ICD-10-CM | POA: Diagnosis not present

## 2023-07-31 DIAGNOSIS — L578 Other skin changes due to chronic exposure to nonionizing radiation: Secondary | ICD-10-CM

## 2023-07-31 DIAGNOSIS — L209 Atopic dermatitis, unspecified: Secondary | ICD-10-CM | POA: Diagnosis not present

## 2023-07-31 DIAGNOSIS — L738 Other specified follicular disorders: Secondary | ICD-10-CM

## 2023-07-31 DIAGNOSIS — L814 Other melanin hyperpigmentation: Secondary | ICD-10-CM

## 2023-07-31 DIAGNOSIS — Z1283 Encounter for screening for malignant neoplasm of skin: Secondary | ICD-10-CM

## 2023-07-31 DIAGNOSIS — Z85828 Personal history of other malignant neoplasm of skin: Secondary | ICD-10-CM

## 2023-07-31 DIAGNOSIS — K1379 Other lesions of oral mucosa: Secondary | ICD-10-CM | POA: Diagnosis not present

## 2023-07-31 DIAGNOSIS — L821 Other seborrheic keratosis: Secondary | ICD-10-CM

## 2023-07-31 DIAGNOSIS — W908XXA Exposure to other nonionizing radiation, initial encounter: Secondary | ICD-10-CM

## 2023-07-31 DIAGNOSIS — L2089 Other atopic dermatitis: Secondary | ICD-10-CM

## 2023-07-31 DIAGNOSIS — D1801 Hemangioma of skin and subcutaneous tissue: Secondary | ICD-10-CM

## 2023-07-31 DIAGNOSIS — Z79899 Other long term (current) drug therapy: Secondary | ICD-10-CM

## 2023-07-31 DIAGNOSIS — L719 Rosacea, unspecified: Secondary | ICD-10-CM

## 2023-07-31 MED ORDER — MOMETASONE FUROATE 0.1 % EX CREA: TOPICAL_CREAM | CUTANEOUS | 1 refills | Status: DC

## 2023-07-31 MED ORDER — METRONIDAZOLE 0.75 % EX GEL: CUTANEOUS | 2 refills | Status: AC

## 2023-07-31 NOTE — Patient Instructions (Addendum)
Leg: Start Mometasone cream twice daily up to 2 weeks on 2 weeks off as needed.   Topical steroids (such as triamcinolone, fluocinolone, fluocinonide, mometasone, clobetasol, halobetasol, betamethasone, hydrocortisone) can cause thinning and lightening of the skin if they are used for too long in the same area. Your physician has selected the right strength medicine for your problem and area affected on the body. Please use your medication only as directed by your physician to prevent side effects.   Face/Rosacea: Start Metrogel 0.75% twice daily to face    Recommend daily broad spectrum sunscreen SPF 30+ to sun-exposed areas, reapply every 2 hours as needed. Call for new or changing lesions.  Staying in the shade or wearing long sleeves, sun glasses (UVA+UVB protection) and wide brim hats (4-inch brim around the entire circumference of the hat) are also recommended for sun protection.    Melanoma ABCDEs  Melanoma is the most dangerous type of skin cancer, and is the leading cause of death from skin disease.  You are more likely to develop melanoma if you: Have light-colored skin, light-colored eyes, or red or blond hair Spend a lot of time in the sun Tan regularly, either outdoors or in a tanning bed Have had blistering sunburns, especially during childhood Have a close family member who has had a melanoma Have atypical moles or large birthmarks  Early detection of melanoma is key since treatment is typically straightforward and cure rates are extremely high if we catch it early.   The first sign of melanoma is often a change in a mole or a new dark spot.  The ABCDE system is a way of remembering the signs of melanoma.  A for asymmetry:  The two halves do not match. B for border:  The edges of the growth are irregular. C for color:  A mixture of colors are present instead of an even brown color. D for diameter:  Melanomas are usually (but not always) greater than 6mm - the size of a  pencil eraser. E for evolution:  The spot keeps changing in size, shape, and color.  Please check your skin once per month between visits. You can use a small mirror in front and a large mirror behind you to keep an eye on the back side or your body.   If you see any new or changing lesions before your next follow-up, please call to schedule a visit.  Please continue daily skin protection including broad spectrum sunscreen SPF 30+ to sun-exposed areas, reapplying every 2 hours as needed when you're outdoors.   Staying in the shade or wearing long sleeves, sun glasses (UVA+UVB protection) and wide brim hats (4-inch brim around the entire circumference of the hat) are also recommended for sun protection.    Due to recent changes in healthcare laws, you may see results of your pathology and/or laboratory studies on MyChart before the doctors have had a chance to review them. We understand that in some cases there may be results that are confusing or concerning to you. Please understand that not all results are received at the same time and often the doctors may need to interpret multiple results in order to provide you with the best plan of care or course of treatment. Therefore, we ask that you please give [place] 2 business days to thoroughly review all your results before contacting the office for clarification. Should we see a critical lab result, you will be contacted sooner.   If You Need Anything After Your  Visit  If you have any questions or concerns for your doctor, please call our main line at [phone] and press option 4 to reach your doctor's medical assistant. If no one answers, please leave a voicemail as directed and we will return your call as soon as possible. Messages left after 4 pm will be answered the following business day.   You may also send [place] a message via MyChart. We typically respond to MyChart messages within 1-2 business days.  For prescription refills, please ask your  pharmacy to contact our office. Our fax number is [phone].  If you have an urgent issue when the clinic is closed that cannot wait until the next business day, you can page your doctor at the number below.    Please note that while we do our best to be available for urgent issues outside of office hours, we are not available 24/7.   If you have an urgent issue and are unable to reach [place], you may choose to seek medical care at your doctor's office, retail clinic, urgent care center, or emergency room.  If you have a medical emergency, please immediately call 911 or go to the emergency department.  Pager Numbers  - Dr. Gwen Pounds: [phone]  - Dr. Roseanne Reno: [phone]  In the event of inclement weather, please call our main line at [phone] for an update on the status of any delays or closures.  Dermatology Medication Tips: Please keep the boxes that topical medications come in in order to help keep track of the instructions about where and how to use these. Pharmacies typically print the medication instructions only on the boxes and not directly on the medication tubes.   If your medication is too expensive, please contact our office at [phone] option 4 or send [place] a message through MyChart.   We are unable to tell what your co-pay for medications will be in advance as this is different depending on your insurance coverage. However, we may be able to find a substitute medication at lower cost or fill out paperwork to get insurance to cover a needed medication.   If a prior authorization is required to get your medication covered by your insurance company, please allow [place] 1-2 business days to complete this process.  Drug prices often vary depending on where the prescription is filled and some pharmacies may offer cheaper prices.  The website www.goodrx.com contains coupons for medications through different pharmacies. The prices here do not account for what the cost may be  with help from insurance (it may be cheaper with your insurance), but the website can give you the price if you did not use any insurance.  - You can print the associated coupon and take it with your prescription to the pharmacy.  - You may also stop by our office during regular business hours and pick up a GoodRx coupon card.  - If you need your prescription sent electronically to a different pharmacy, notify our office through [facility] or by phone at [phone] option 4.     Si Usted Necesita Algo Despus de Su Visita  Tambin puede enviarnos un mensaje a travs de Clinical cytogeneticist. Por lo general respondemos a los mensajes de MyChart en el transcurso de 1 a 2 das hbiles.  Para renovar recetas, por favor pida a su farmacia que se ponga en contacto con nuestra oficina. Annie Sable de fax es [place] [phone].  Si tiene un asunto urgente cuando la clnica est cerrada y que  no puede esperar hasta el siguiente da hbil, puede llamar/localizar a su doctor(a) al nmero que aparece a continuacin.   Por favor, tenga en cuenta que aunque hacemos todo lo posible para estar disponibles para asuntos urgentes fuera del horario de [place], no estamos disponibles las 24 horas del da, los 7 [address]  [address] de la [place].   Si tiene un problema urgente y no puede comunicarse con nosotros, puede optar por buscar atencin mdica  en el consultorio de su doctor(a), en una clnica privada, en un centro de atencin urgente o en una sala de emergencias.  Si tiene Engineer, drilling, por favor llame inmediatamente al 911 o vaya a la sala de emergencias.  Nmeros de bper  - Dr. Gwen Pounds: [phone]  - Dra. Roseanne Reno: [phone]  En caso de inclemencias del [place], por favor llame a Lacy Duverney principal al [phone] para una actualizacin sobre el [place] de cualquier retraso o cierre.  Consejos para la medicacin en dermatologa: Por favor, guarde las cajas en las que vienen los medicamentos de uso  tpico para ayudarle a seguir las instrucciones sobre dnde y cmo usarlos. Las farmacias generalmente imprimen las instrucciones del medicamento slo en las cajas y no directamente en los tubos del [place].   Si su medicamento es muy caro, por favor, pngase en contacto con Rolm [place]ala llamando al [phone] y presione la opcin 4 o envenos un mensaje a travs de Clinical cytogeneticist.   No podemos decirle cul ser su copago por los medicamentos por adelantado ya que esto es diferente dependiendo de la cobertura de su seguro. Sin embargo, es posible que podamos encontrar un medicamento sustituto a Audiological scientist un formulario para que el seguro cubra el medicamento que se considera necesario.   Si se requiere una autorizacin previa para que su compaa de seguros [place] su medicamento, por favor permtanos de 1 a 2 das hbiles para completar [address]  Los precios de los medicamentos varan con frecuencia dependiendo del Environmental consultant de dnde se surte la receta y alguna farmacias pueden ofrecer precios ms baratos.  El sitio web www.goodrx.com tiene cupones para medicamentos de Health and safety inspector. Los precios aqu no tienen en cuenta lo que podra costar con la ayuda del seguro (puede ser ms barato con su seguro), pero el sitio web puede darle el precio si no utiliz Tourist information centre manager.  - Puede imprimir el cupn correspondiente y llevarlo con su receta a la farmacia.  - Tambin puede pasar por nuestra oficina durante el horario de atencin regular y Education officer, museum una tarjeta de cupones de GoodRx.  - Si necesita que su receta se enve electrnicamente a una farmacia diferente, informe a nuestra oficina a travs de MyChart de [place] o por telfono llamando al [phone] y presione la opcin 4.

## 2023-07-31 NOTE — Progress Notes (Signed)
New Patient Visit   Subjective  Amber Mills is a 62 y.o. female who presents for the following: Skin Cancer Screening and Full Body Skin Exam. Hx of BCC on nose. Mohs surgery performed 11/12/2022. Has several areas of concern.  The patient presents for Total-Body Skin Exam (TBSE) for skin cancer screening and mole check. The patient has spots, moles and lesions to be evaluated, some may be new or changing and the patient may have concern these could be cancer.  The following portions of the chart were reviewed this encounter and updated as appropriate: medications, allergies, medical history  Review of Systems:  No other skin or systemic complaints except as noted in HPI or Assessment and Plan.  Objective  Well appearing patient in no apparent distress; mood and affect are within normal limits.  A full examination was performed including scalp, head, eyes, ears, nose, lips, neck, chest, axillae, abdomen, back, buttocks, bilateral upper extremities, bilateral lower extremities, hands, feet, fingers, toes, fingernails, and toenails. All findings within normal limits unless otherwise noted below.   Relevant physical exam findings are noted in the Assessment and Plan.    Assessment & Plan   HISTORY OF BASAL CELL CARCINOMA OF THE SKIN.   Right superior lateral tip of nose. Mohs with Dr. Adriana Simas 11/12/2022.  - No evidence of recurrence today - Recommend regular full body skin exams - Recommend daily broad spectrum sunscreen SPF 30+ to sun-exposed areas, reapply every 2 hours as needed.  - Call if any new or changing lesions are noted between office visits  SKIN CANCER SCREENING PERFORMED TODAY.  ACTINIC DAMAGE - Chronic condition, secondary to cumulative UV/sun exposure - diffuse scaly erythematous macules with underlying dyspigmentation - Recommend daily broad spectrum sunscreen SPF 30+ to sun-exposed areas, reapply every 2 hours as needed.  - Staying in the shade or wearing long  sleeves, sun glasses (UVA+UVB protection) and wide brim hats (4-inch brim around the entire circumference of the hat) are also recommended for sun protection.  - Call for new or changing lesions.  LENTIGINES, SEBORRHEIC KERATOSES, HEMANGIOMAS - Benign normal skin lesions - Benign-appearing - Call for any changes  MELANOCYTIC NEVI - Tan-brown and/or pink-flesh-colored symmetric macules and papules - Benign appearing on exam today - Observation - Call clinic for new or changing moles - Recommend daily use of broad spectrum spf 30+ sunscreen to sun-exposed areas.   ROSACEA Exam Mid face erythema with telangiectasias and scattered inflammatory papules at face Chronic and persistent condition with duration or expected duration over one year. Condition is bothersome/symptomatic for patient. Currently flared. Rosacea is a chronic progressive skin condition usually affecting the face of adults, causing redness and/or acne bumps. It is treatable but not curable. It sometimes affects the eyes (ocular rosacea) as well. It may respond to topical and/or systemic medication and can flare with stress, sun exposure, alcohol, exercise, topical steroids (including hydrocortisone/cortisone 10) and some foods.  Daily application of broad spectrum spf 30+ sunscreen to face is recommended to reduce flares.  Patient denies grittiness of the eyes  Treatment Plan Start Metrogel 0.75% twice daily to face  Sebaceous Hyperplasia - Small yellow papules with a central dell - Benign-appearing - Observe. Call for changes.   Oral mucocele Exam: Right lower lip mucosa 0.5 cm clear papule Comes and goes.  See photo. Treatment:  Observe. Consider excision in future if necessary.  ATOPIC DERMATITIS/Nummular Dermatitis Exam: Scaly pink plaque at left pretibial 1% BSA Chronic and persistent condition with duration  or expected duration over one year. Condition is bothersome/symptomatic for patient. Currently  flared. Atopic dermatitis (eczema) is a chronic, relapsing, pruritic condition that can significantly affect quality of life. It is often associated with allergic rhinitis and/or asthma and can require treatment with topical medications, phototherapy, or in severe cases biologic injectable medication (Dupixent; Adbry) or Oral JAK inhibitors.  Treatment Plan: Start Mometasone cream twice daily up to 2 weeks on 2 weeks off as needed.   Topical steroids (such as triamcinolone, fluocinolone, fluocinonide, mometasone, clobetasol, halobetasol, betamethasone, hydrocortisone) can cause thinning and lightening of the skin if they are used for too long in the same area. Your physician has selected the right strength medicine for your problem and area affected on the body. Please use your medication only as directed by your physician to prevent side effects.   Recommend gentle skin care.   Return in about 4 months (around 11/30/2023) for Rosacea Follow Up, Rash Follow Up.  I, Lawson Radar, CMA, am acting as scribe for Armida Sans, MD.  Documentation: I have reviewed the above documentation for accuracy and completeness, and I agree with the above.  Armida Sans, MD

## 2023-08-05 ENCOUNTER — Encounter: Payer: Self-pay | Admitting: Dermatology

## 2023-08-26 ENCOUNTER — Other Ambulatory Visit: Payer: Self-pay

## 2023-08-27 ENCOUNTER — Other Ambulatory Visit: Payer: Self-pay

## 2023-08-27 ENCOUNTER — Ambulatory Visit (INDEPENDENT_AMBULATORY_CARE_PROVIDER_SITE_OTHER): Payer: 59 | Admitting: Gastroenterology

## 2023-08-27 ENCOUNTER — Encounter: Payer: Self-pay | Admitting: Gastroenterology

## 2023-08-27 VITALS — BP 125/80 | HR 72 | Temp 98.2°F | Ht 61.0 in | Wt 204.0 lb

## 2023-08-27 DIAGNOSIS — R1013 Epigastric pain: Secondary | ICD-10-CM

## 2023-08-27 DIAGNOSIS — Z86010 Personal history of colonic polyps: Secondary | ICD-10-CM

## 2023-08-27 DIAGNOSIS — R1011 Right upper quadrant pain: Secondary | ICD-10-CM | POA: Diagnosis not present

## 2023-08-27 DIAGNOSIS — K219 Gastro-esophageal reflux disease without esophagitis: Secondary | ICD-10-CM | POA: Diagnosis not present

## 2023-08-27 MED ORDER — NA SULFATE-K SULFATE-MG SULF 17.5-3.13-1.6 GM/177ML PO SOLN: 354.0000 mL | Freq: Once | ORAL | 0 refills | Status: AC

## 2023-08-27 NOTE — Progress Notes (Signed)
Arlyss Repress, MD [address]  [address]  [place], [place] [address]  Main: [phone]  Fax: [phone]    Gastroenterology Consultation  Referring Provider:     Marguarite Arbour, MD Primary Care Physician:  Marguarite Arbour, MD Primary Gastroenterologist:  Dr. Arlyss Repress Reason for Consultation: Dyspepsia, heartburn        HPI:   Amber Mills is a 62 y.o. female referred by Dr. Judithann Sheen, Duane Lope, MD  for consultation & management of chronic upper abdominal pain.  She was last seen by me in 2021 for change in bowel habits.  She is here today to discuss about chronic upper abdominal pain, predominantly in the right upper quadrant more or less constant and sometimes across upper abdomen, burning in the stomach as well as heartburn.  She takes Protonix 40 mg 1-2 times daily.  She admits to drinking 6 bottles of Pepsi daily since her childhood.  She has been gaining weight.  Denies any diarrhea or constipation.  She does report mild lower abdominal discomfort which is intermittent associated with bloating.  She does not eat healthy.  She was evaluated at [facility] in 09/2018 for upper abdominal pain, and this was thought to be secondary to nonulcer dyspepsia.  She had multiple EGDs and colonoscopies in the past which were unremarkable. She had right upper quadrant ultrasound which revealed adenomyomatosis of the gallbladder but no evidence of cholelithiasis.  CT abdomen pelvis in 2022 was unremarkable.    NSAIDs: None  Antiplts/Anticoagulants/Anti thrombotics: Plavix with history of coronary disease  GI Procedures: colonoscopy 02/12/12 and 06/12/15 showing a TA and HP's, respectively EGD 05/31/15 showing mild duodenitis; biopsies for negative for Celiac disease and H pylori  Colonoscopy 09/07/2020 - One 15 mm polyp in the transverse colon, removed with a hot snare. Resected and retrieved. Clip ( MR conditional) was placed. - One 6 mm polyp in the transverse colon, removed with a  hot snare. Resected and retrieved. - One 15 mm polyp in the descending colon, removed with a hot snare. Resected and retrieved. - One 5 mm polyp in the transverse colon, removed with a cold snare. Resected and retrieved. - The distal rectum and anal verge are normal on retroflexion view. - Diverticulosis in the sigmoid colon.  DIAGNOSIS:  A. COLON POLYP X 2, TRANSVERSE; HOT SNARE AND COLD SNARE:  - TUBULOVILLOUS ADENOMA, PEDUNCULATED, 10 MM, WITH STALK MARGIN FREE OF  DYSPLASIA.  - TUBULAR ADENOMA, 3 MM, ADMIXED WITH FECAL MATERIAL.  - NEGATIVE FOR HIGH-GRADE DYSPLASIA AND MALIGNANCY.   B.  COLON POLYP X 2, DESCENDING; HOT SNARE AND COLD SNARE:  - TUBULAR ADENOMA.  - NEGATIVE FOR HIGH-GRADE DYSPLASIA AND MALIGNANCY.   Past Medical History:  Diagnosis Date   Basal cell carcinoma 11/12/2022   Right superior lateral tip of nose. Mohs with Dr. Adriana Simas.   Bipolar affective disorder (HCC)    GERD (gastroesophageal reflux disease)    Hyperlipemia    Hypertension    Myocardial infarction (HCC)    Panic attack     Past Surgical History:  Procedure Laterality Date   ABDOMINAL HYSTERECTOMY     bladder mesh     CARDIAC CATHETERIZATION     COLONOSCOPY WITH PROPOFOL N/A 09/07/2020   Procedure: COLONOSCOPY WITH PROPOFOL;  Surgeon: Toney Reil, MD;  Location: [facility] ENDOSCOPY;  Service: Gastroenterology;  Laterality: N/A;   CORONARY STENT INTERVENTION N/A 07/17/2017   Procedure: Coronary Stent Intervention;  Surgeon: Alwyn Pea,  MD;  Location: [facility] INVASIVE CV LAB;  Service: Cardiovascular;  Laterality: N/A;   HERNIA REPAIR     LEFT HEART CATH AND CORONARY ANGIOGRAPHY N/A 07/17/2017   Procedure: Left Heart Cath and Coronary Angiography and possible pci;  Surgeon: Alwyn Pea, MD;  Location: [facility] INVASIVE CV LAB;  Service: Cardiovascular;  Laterality: N/A;   TONSILLECTOMY      Current Outpatient Medications:    aspirin 81 MG chewable tablet, Chew by mouth daily., Disp: , Rfl:     atorvastatin (LIPITOR) 80 MG tablet, Take 1 tablet (80 mg total) by mouth daily at 6 PM., Disp: 30 tablet, Rfl: 0   bisoprolol-hydrochlorothiazide (ZIAC) 2.5-6.25 MG tablet, Take 1 tablet by mouth daily., Disp: , Rfl:    clonazePAM (KLONOPIN) 0.5 MG tablet, Take 0.25 mg by mouth 2 (two) times daily., Disp: , Rfl:    clotrimazole-betamethasone (LOTRISONE) cream, APPLY TO AFFECTED AREA TWICE A DAY, Disp: , Rfl:    divalproex (DEPAKOTE ER) 250 MG 24 hr tablet, Take 250 mg by mouth daily., Disp: , Rfl:    metroNIDAZOLE (METROGEL) 0.75 % gel, Apply twice daily to face for rosacea, Disp: 45 g, Rfl: 2   mometasone (ELOCON) 0.1 % cream, Apply twice daily to area on leg up to 2 weeks on 2 weeks off as needed., Disp: 45 g, Rfl: 1   olmesartan (BENICAR) 40 MG tablet, Take 40 mg by mouth daily., Disp: , Rfl:    pantoprazole (PROTONIX) 40 MG tablet, Take 1 tablet (40 mg total) by mouth 2 (two) times daily., Disp: 60 tablet, Rfl: 0   Family History  Problem Relation Age of Onset   CAD Father    Breast cancer Maternal Aunt    Breast cancer Paternal Aunt      Social History   Tobacco Use   Smoking status: Former   Smokeless tobacco: Never  Vaping Use   Vaping status: Never Used  Substance Use Topics   Alcohol use: No   Drug use: No    Allergies as of 08/27/2023 - Review Complete 08/27/2023  Allergen Reaction Noted   Other Anaphylaxis 07/16/2017   Zofran [ondansetron hcl] Itching 07/17/2017   Buspirone  10/21/2018   Ciprofloxacin Nausea And Vomiting 07/30/2021   Haloperidol  10/21/2018   Sertraline  10/21/2018   Sulfamethoxazole-trimethoprim Other (See Comments) 04/13/2022   Penicillins Rash 07/16/2017    Review of Systems:    All systems reviewed and negative except where noted in HPI.   Physical Exam:  BP (!) 155/82 (BP Location: Right Arm, Patient Position: Sitting, Cuff Size: Large)   Pulse 94   Temp 98.2 F (36.8 C) (Oral)   Ht 5\' 1"  (1.549 m)   Wt 204 lb (92.5 kg)   BMI  38.55 kg/m  No LMP recorded. Patient has had a hysterectomy.  General:   Alert,  Well-developed, well-nourished, pleasant and cooperative in NAD Head:  Normocephalic and atraumatic. Eyes:  Sclera clear, no icterus.   Conjunctiva pink. Ears:  Normal auditory acuity. Nose:  No deformity, discharge, or lesions. Mouth:  No deformity or lesions,oropharynx pink & moist. Neck:  Supple; no masses or thyromegaly. Lungs:  Respirations even and unlabored.  Clear throughout to auscultation.   No wheezes, crackles, or rhonchi. No acute distress. Heart:  Regular rate and rhythm; no murmurs, clicks, rubs, or gallops. Abdomen:  Normal bowel sounds. Soft, non-tender and non-distended without masses, hepatosplenomegaly or hernias noted.  No guarding or rebound tenderness.   Rectal: Normal perianal exam,  scar tissue from previous episiotomy, soft brown stool in the rectal vault, nontender, no palpable lesions identified except hemorrhoids Msk:  Symmetrical without gross deformities. Good, equal movement & strength bilaterally. Pulses:  Normal pulses noted. Extremities:  No clubbing or edema.  No cyanosis. Neurologic:  Alert and oriented x3;  grossly normal neurologically. Skin:  Intact without significant lesions or rashes. No jaundice. Psych:  Alert and cooperative. Normal mood and affect.  Imaging Studies: Reviewed  Assessment and Plan:   Amber Mills is a 62 y.o. female with history of coronary artery disease, status post PCI with stent placement on aspirin 81 mg daily is seen in consultation for right upper quadrant discomfort, symptoms of dyspepsia and GERD.  Her symptoms are secondary to poor eating habits and heavy intake of carbonated beverages.  Discussed with patient in length regarding consumption of processed foods affecting gut microbiome, causes dysbiosis which in turn causes symptoms of dyspepsia, irritable bowel syndrome, triggers GERD.  I do not think her symptoms are biliary etiology.   No evidence of cholelithiasis.  Patient had EGD and colonoscopy in the past which were unremarkable.  Continue Protonix 40 mg twice daily Advised patient to incorporate physical activity, exercise 4-5 times a week Advised to wean off the carbonated beverages  Personal history of colon adenomas Recommend surveillance colonoscopy and patient is agreeable  Follow up as needed   Arlyss Repress, MD

## 2023-10-03 ENCOUNTER — Other Ambulatory Visit: Payer: Self-pay | Admitting: Internal Medicine

## 2023-10-03 DIAGNOSIS — R1010 Upper abdominal pain, unspecified: Secondary | ICD-10-CM

## 2023-10-04 ENCOUNTER — Other Ambulatory Visit: Payer: Self-pay | Admitting: Dermatology

## 2023-10-06 ENCOUNTER — Telehealth: Payer: Self-pay

## 2023-10-06 NOTE — Telephone Encounter (Signed)
Pt requesting call back to  reschedule  procedure

## 2023-10-06 NOTE — Telephone Encounter (Signed)
Patient states she has a head cold and her ear are stopped up. She wants to reschedule her procedure for 10/08/2023. We rescheduled to 10/23/2023.

## 2023-10-21 ENCOUNTER — Ambulatory Visit
Admission: RE | Admit: 2023-10-21 | Discharge: 2023-10-21 | Disposition: A | Discharge status: Home / Self Care | Payer: 59 | Source: Ambulatory Visit | Attending: Internal Medicine | Admitting: Internal Medicine

## 2023-10-21 DIAGNOSIS — R1010 Upper abdominal pain, unspecified: Secondary | ICD-10-CM

## 2023-10-22 ENCOUNTER — Telehealth: Payer: Self-pay

## 2023-10-22 ENCOUNTER — Telehealth: Payer: Self-pay | Admitting: Gastroenterology

## 2023-10-22 NOTE — Telephone Encounter (Signed)
Patient has requested to cancel colonoscopy scheduled for tomorrow with Dr. Allegra Lai she is not feeling well.  She apologizes for having to reschedule but she's not up to it. Informed her that we will allow another reschedule once she is feeling better she can call back to schedule.  Thanks, [place], [place]

## 2023-10-22 NOTE — Telephone Encounter (Signed)
The patient called in needing to reschedule there appointment. Patient has an headache, cough, running nose, sore throat and etc. The patient procedure it tomorrow.

## 2023-10-23 ENCOUNTER — Encounter: Admission: RE | Payer: Self-pay | Source: Home / Self Care

## 2023-10-23 ENCOUNTER — Ambulatory Visit: Admission: RE | Admit: 2023-10-23 | Payer: 59 | Source: Home / Self Care | Admitting: Gastroenterology

## 2023-10-23 SURGERY — COLONOSCOPY WITH PROPOFOL
Anesthesia: General

## 2023-11-30 ENCOUNTER — Other Ambulatory Visit: Payer: Self-pay | Admitting: Dermatology

## 2023-12-02 ENCOUNTER — Telehealth: Payer: Self-pay

## 2023-12-02 NOTE — Telephone Encounter (Signed)
Tried calling patient regarding need for an appointment in future. Patient is in need for a follow up appointment in August of 2025 to continue getting refills of mometasone cream.  Patient had sent refill request in mometasone a refill was sent to patient's pharmacy but noted to pharmacy patient would like follow up appointment in future for additional refills.

## 2023-12-03 NOTE — Telephone Encounter (Signed)
Patient advised and scheduled for an appt tomorrow to have her nose checked. Will schedule 1 year follow up at check out. aw

## 2023-12-04 ENCOUNTER — Ambulatory Visit: Payer: 59 | Admitting: Dermatology

## 2023-12-04 ENCOUNTER — Ambulatory Visit (INDEPENDENT_AMBULATORY_CARE_PROVIDER_SITE_OTHER): Payer: 59 | Admitting: Dermatology

## 2023-12-04 ENCOUNTER — Encounter: Payer: Self-pay | Admitting: Dermatology

## 2023-12-04 DIAGNOSIS — L578 Other skin changes due to chronic exposure to nonionizing radiation: Secondary | ICD-10-CM | POA: Diagnosis not present

## 2023-12-04 DIAGNOSIS — W908XXA Exposure to other nonionizing radiation, initial encounter: Secondary | ICD-10-CM

## 2023-12-04 DIAGNOSIS — Z79899 Other long term (current) drug therapy: Secondary | ICD-10-CM

## 2023-12-04 DIAGNOSIS — L711 Rhinophyma: Secondary | ICD-10-CM | POA: Diagnosis not present

## 2023-12-04 DIAGNOSIS — L738 Other specified follicular disorders: Secondary | ICD-10-CM | POA: Diagnosis not present

## 2023-12-04 DIAGNOSIS — Z7189 Other specified counseling: Secondary | ICD-10-CM

## 2023-12-04 DIAGNOSIS — Z85828 Personal history of other malignant neoplasm of skin: Secondary | ICD-10-CM

## 2023-12-04 DIAGNOSIS — L719 Rosacea, unspecified: Secondary | ICD-10-CM

## 2023-12-04 NOTE — Patient Instructions (Signed)
Will prescribe [facility] metronidazole/ivermectin/azelaic acid twice daily as needed to affected areas on the face. The patient was advised this is not covered by insurance since it is made by a compounding pharmacy. They will receive an email to check out and the medication will be mailed to their home.   Instructions for [facility] Medications  One or more of your medications was sent to the [facility] mail order compounding pharmacy. You will receive an email from them and can purchase the medicine through that link. It will then be mailed to your home at the address you confirmed. If for any reason you do not receive an email from them, please check your spam folder. If you still do not find the email, please let us know. [facility] phone number is [phone].  Due to recent changes in healthcare laws, you may see results of your pathology and/or laboratory studies on MyChart before the doctors have had a chance to review them. We understand that in some cases there may be results that are confusing or concerning to you. Please understand that not all results are received at the same time and often the doctors may need to interpret multiple results in order to provide you with the best plan of care or course of treatment. Therefore, we ask that you please give [place] 2 business days to thoroughly review all your results before contacting the office for clarification. Should we see a critical lab result, you will be contacted sooner.   If You Need Anything After Your Visit  If you have any questions or concerns for your doctor, please call our main line at [phone] and press option 4 to reach your doctor's medical assistant. If no one answers, please leave a voicemail as directed and we will return your call as soon as possible. Messages left after 4 pm will be answered the following business day.   You may also send [place] a message via MyChart. We typically respond to MyChart  messages within 1-2 business days.  For prescription refills, please ask your pharmacy to contact our office. Our fax number is [phone].  If you have an urgent issue when the clinic is closed that cannot wait until the next business day, you can page your doctor at the number below.    Please note that while we do our best to be available for urgent issues outside of office hours, we are not available 24/7.   If you have an urgent issue and are unable to reach [place], you may choose to seek medical care at your doctor's office, retail clinic, urgent care center, or emergency room.  If you have a medical emergency, please immediately call 911 or go to the emergency department.  Pager Numbers  - Dr. Gwen Pounds: [phone]  - Dr. Roseanne Reno: [phone]  - Dr. Katrinka Blazing: [phone]   In the event of inclement weather, please call our main line at [phone] for an update on the status of any delays or closures.  Dermatology Medication Tips: Please keep the boxes that topical medications come in in order to help keep track of the instructions about where and how to use these. Pharmacies typically print the medication instructions only on the boxes and not directly on the medication tubes.   If your medication is too expensive, please contact our office at [phone] option 4 or send [place] a message through MyChart.   We are unable to tell what your co-pay for medications will be in advance as  this is different depending on your insurance coverage. However, we may be able to find a substitute medication at lower cost or fill out paperwork to get insurance to cover a needed medication.   If a prior authorization is required to get your medication covered by your insurance company, please allow [place] 1-2 business days to complete this process.  Drug prices often vary depending on where the prescription is filled and some pharmacies may offer cheaper prices.  The website www.goodrx.com contains  coupons for medications through different pharmacies. The prices here do not account for what the cost may be with help from insurance (it may be cheaper with your insurance), but the website can give you the price if you did not use any insurance.  - You can print the associated coupon and take it with your prescription to the pharmacy.  - You may also stop by our office during regular business hours and pick up a GoodRx coupon card.  - If you need your prescription sent electronically to a different pharmacy, notify our office through [facility] or by phone at [phone] option 4.     Si Usted Necesita Algo Despus de Su Visita  Tambin puede enviarnos un mensaje a travs de Clinical cytogeneticist. Por lo general respondemos a los mensajes de MyChart en el transcurso de 1 a 2 das hbiles.  Para renovar recetas, por favor pida a su farmacia que se ponga en contacto con nuestra oficina. Annie Sable de fax es [place] [phone].  Si tiene un asunto urgente cuando la clnica est cerrada y que no puede esperar hasta el siguiente da hbil, puede llamar/localizar a su doctor(a) al nmero que aparece a continuacin.   Por favor, tenga en cuenta que aunque hacemos todo lo posible para estar disponibles para asuntos urgentes fuera del horario de [place], no estamos disponibles las 24 horas del da, los 7 [address]  [address] de la [place].   Si tiene un problema urgente y no puede comunicarse con nosotros, puede optar por buscar atencin mdica  en el consultorio de su doctor(a), en una clnica privada, en un centro de atencin urgente o en una sala de emergencias.  Si tiene Engineer, drilling, por favor llame inmediatamente al 911 o vaya a la sala de emergencias.  Nmeros de bper  - Dr. Gwen Pounds: [phone]  - Dra. Roseanne Reno: [phone]  - Dr. Katrinka Blazing: [phone]   En caso de inclemencias del tiempo, por favor llame a Lacy Duverney principal al [phone] para una actualizacin sobre el [place] de  cualquier retraso o cierre.  Consejos para la medicacin en dermatologa: Por favor, guarde las cajas en las que vienen los medicamentos de uso tpico para ayudarle a seguir las instrucciones sobre dnde y cmo usarlos. Las farmacias generalmente imprimen las instrucciones del medicamento slo en las cajas y no directamente en los tubos del [place].   Si su medicamento es muy caro, por favor, pngase en contacto con Rolm [place] llamando al [phone] y presione la opcin 4 o envenos un mensaje a travs de Clinical cytogeneticist.   No podemos decirle cul ser su copago por los medicamentos por adelantado ya que esto es diferente dependiendo de la cobertura de su seguro. Sin embargo, es posible que podamos encontrar un medicamento sustituto a Audiological scientist un formulario para que el seguro cubra el medicamento que se considera necesario.   Si se requiere una autorizacin previa para que su compaa de seguros [place] su medicamento, por favor permtanos de 1 a 2 [address]  [address]  hbiles para completar este proceso.  Los precios de los medicamentos varan con frecuencia dependiendo del Environmental consultant de dnde se surte la receta y alguna farmacias pueden ofrecer precios ms baratos.  El sitio web www.goodrx.com tiene cupones para medicamentos de Health and safety inspector. Los precios aqu no tienen en cuenta lo que podra costar con la ayuda del seguro (puede ser ms barato con su seguro), pero el sitio web puede darle el precio si no utiliz Tourist information centre manager.  - Puede imprimir el cupn correspondiente y llevarlo con su receta a la farmacia.  - Tambin puede pasar por nuestra oficina durante el horario de atencin regular y Education officer, museum una tarjeta de cupones de GoodRx.  - Si necesita que su receta se enve electrnicamente a una farmacia diferente, informe a nuestra oficina a travs de MyChart de [place] o por telfono llamando al [phone] y presione la opcin 4.

## 2023-12-04 NOTE — Progress Notes (Signed)
   Follow-Up Visit   Subjective  Amber Mills is a 62 y.o. female who presents for the following: spots at nose that she scratches and then they bleed. Patient has had Mohs for [facility] at right superior lateral tip of nose.   The patient has spots, moles and lesions to be evaluated, some may be new or changing and the patient may have concern these could be cancer.   The following portions of the chart were reviewed this encounter and updated as appropriate: medications, allergies, medical history  Review of Systems:  No other skin or systemic complaints except as noted in HPI or Assessment and Plan.  Objective  Well appearing patient in no apparent distress; mood and affect are within normal limits.   A focused examination was performed of the following areas: face  Relevant exam findings are noted in the Assessment and Plan.    Assessment & Plan   ROSACEA w/rhinophyma  Exam Small red papules at nose  Chronic and persistent condition with duration or expected duration over one year. Condition is bothersome/symptomatic for patient. Currently flared.   Rosacea is a chronic progressive skin condition usually affecting the face of adults, causing redness and/or acne bumps. It is treatable but not curable. It sometimes affects the eyes (ocular rosacea) as well. It may respond to topical and/or systemic medication and can flare with stress, sun exposure, alcohol, exercise, topical steroids (including hydrocortisone/cortisone 10) and some foods.  Daily application of broad spectrum spf 30+ sunscreen to face is recommended to reduce flares.   Treatment Plan Will prescribe [facility] metronidazole/ivermectin/azelaic acid twice daily as needed to affected areas on the face. The patient was advised this is not covered by insurance since it is made by a compounding pharmacy. They will receive an email to check out and the medication will be mailed to their home.   Patient not able to  take doxycycline.  Consider Z-pak if indicated.    HISTORY OF BASAL CELL CARCINOMA OF THE SKIN - Right superior lateral tip of nose. Mohs with Dr. Adriana Simas    11/12/2022  - scar with no evidence of recurrence today - Recommend regular full body skin exams - Recommend daily broad spectrum sunscreen SPF 30+ to sun-exposed areas, reapply every 2 hours as needed.  - Call if any new or changing lesions are noted between office visits  Sebaceous Hyperplasia - Small yellow papules with a central dell - Benign-appearing - Observe. Call for changes. - Discussed PDT   ACTINIC DAMAGE - chronic, secondary to cumulative UV radiation exposure/sun exposure over time - diffuse scaly erythematous macules with underlying dyspigmentation - Recommend daily broad spectrum sunscreen SPF 30+ to sun-exposed areas, reapply every 2 hours as needed.  - Recommend staying in the shade or wearing long sleeves, sun glasses (UVA+UVB protection) and wide brim hats (4-inch brim around the entire circumference of the hat). - Call for new or changing lesions.  Return in about 6 months (around 06/03/2024) for Rosacea, TBSE, Hx BCC.  Anise Salvo, RMA, am acting as scribe for Armida Sans, MD .   Documentation: I have reviewed the above documentation for accuracy and completeness, and I agree with the above.  Armida Sans, MD

## 2023-12-31 ENCOUNTER — Other Ambulatory Visit: Payer: Self-pay | Admitting: Dermatology

## 2023-12-31 DIAGNOSIS — L2089 Other atopic dermatitis: Secondary | ICD-10-CM

## 2024-01-28 IMAGING — MG MM DIGITAL SCREENING BILAT W/ TOMO AND CAD
8 series · 8 of 24 positions shown · non-contrast
Comparison: Previous exam(s).

CLINICAL DATA: Screening.

EXAM:
DIGITAL SCREENING BILATERAL MAMMOGRAM WITH TOMOSYNTHESIS AND CAD
TECHNIQUE: Bilateral screening digital craniocaudal and mediolateral oblique
mammograms were obtained. Bilateral screening digital breast
tomosynthesis was performed. The images were evaluated with
computer-aided detection.

[R CC synth-2D]
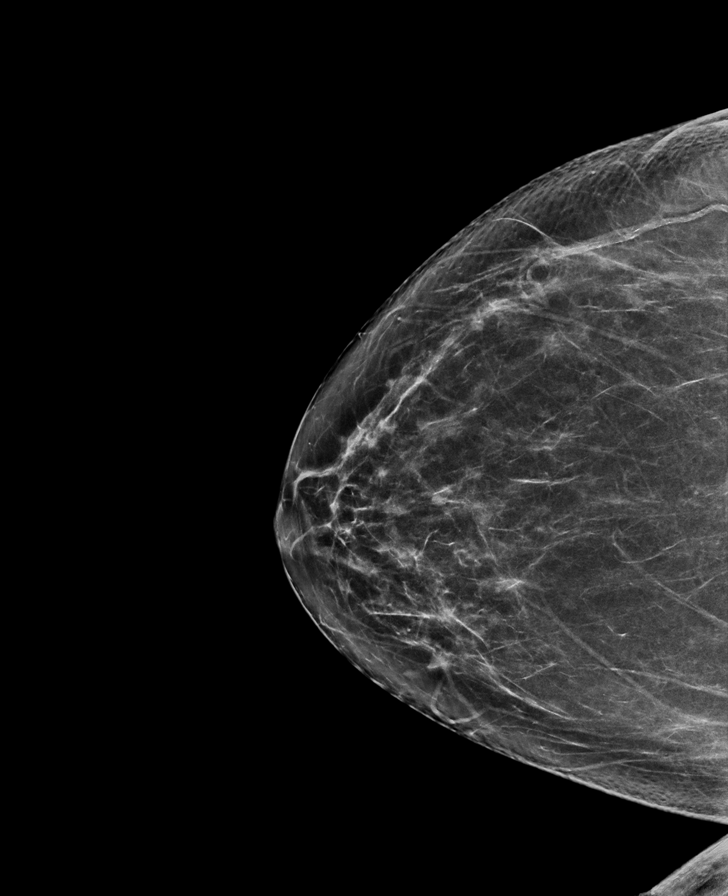

[R MLO synth-2D]
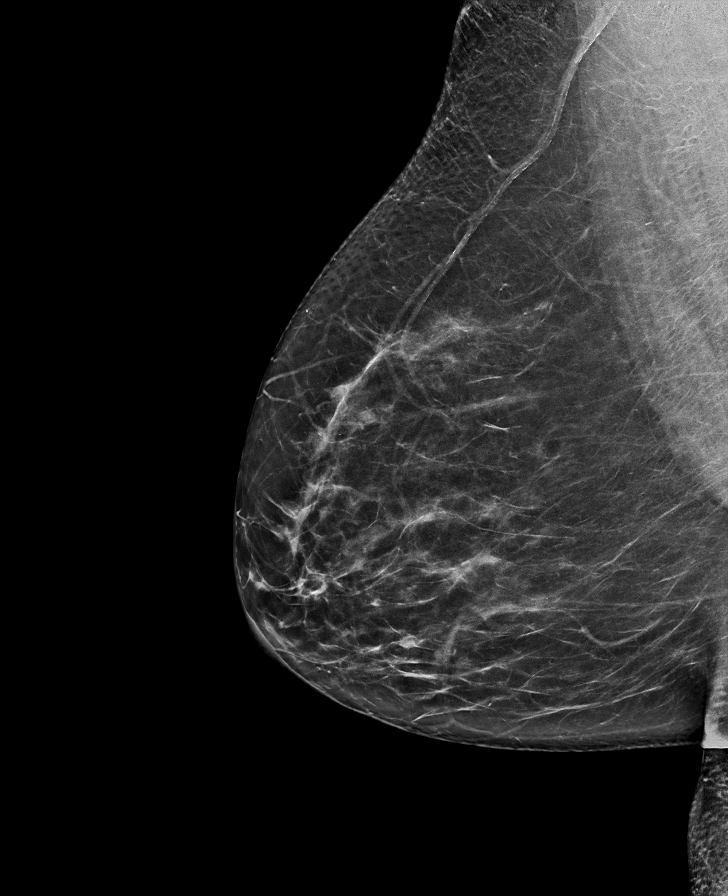

[L CC synth-2D]
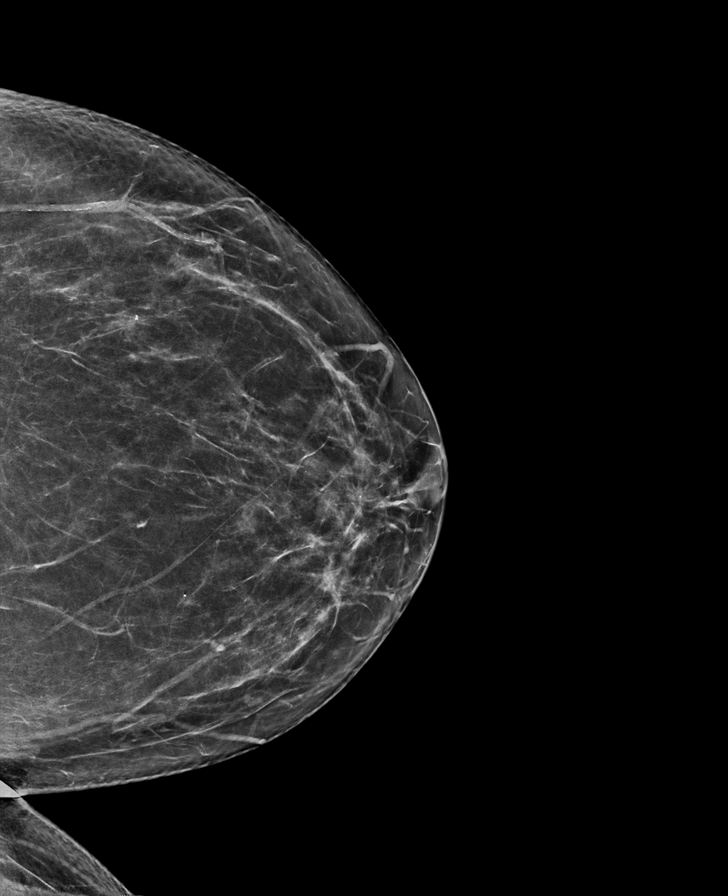

[L MLO synth-2D]
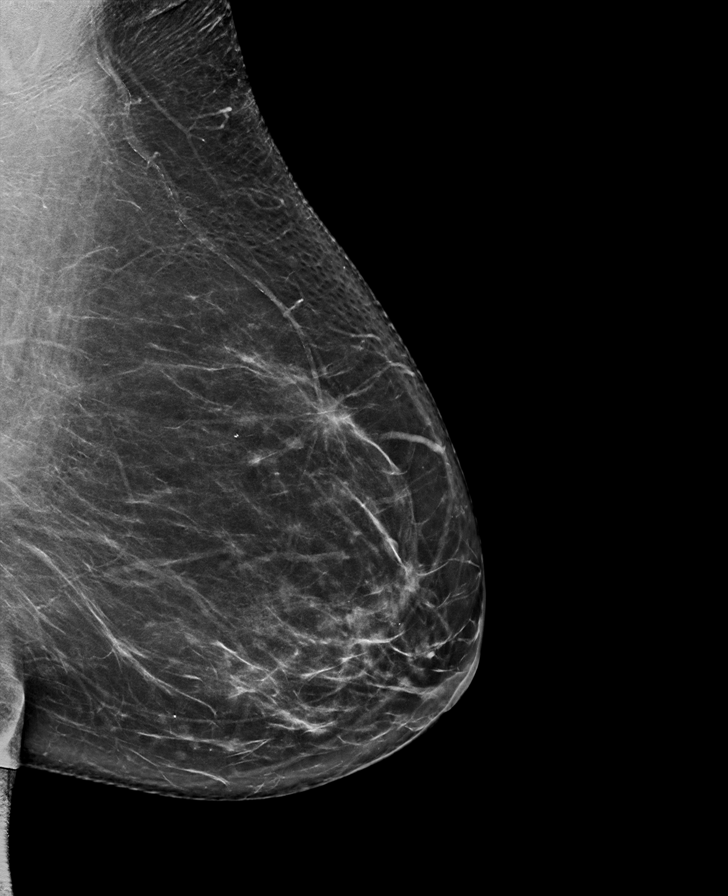

[R MLO tomo · tomo slice 37/72.0]
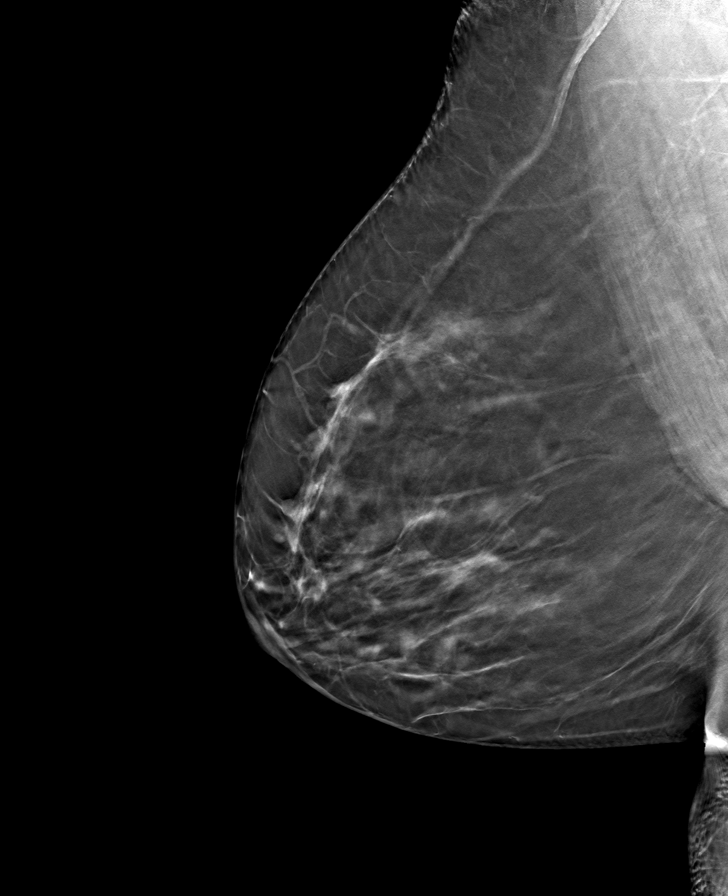

[L MLO tomo · tomo slice 39/78.0]
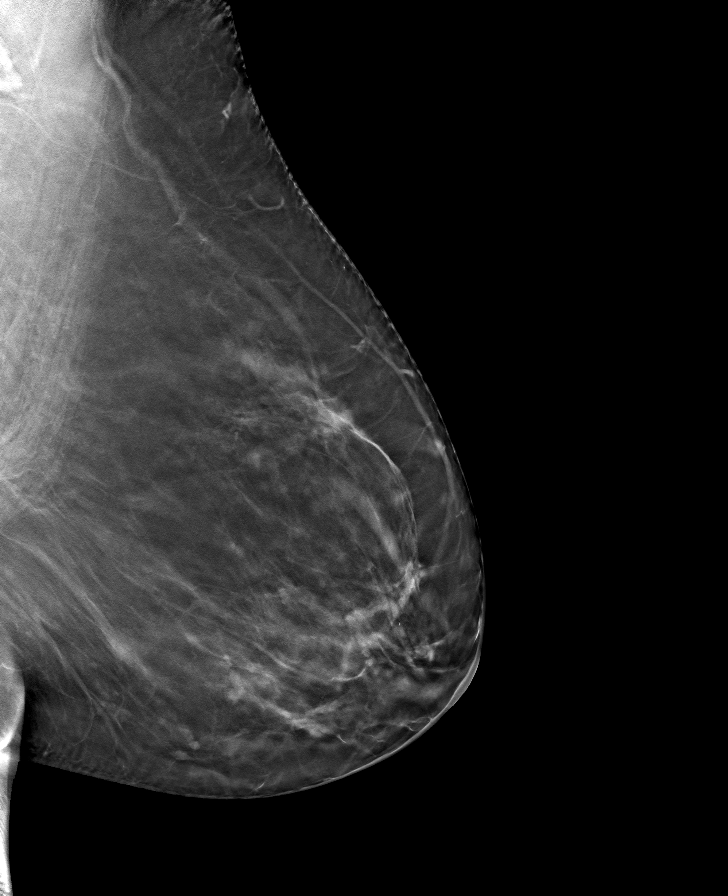

[R CC tomo · tomo slice 36/71.0]
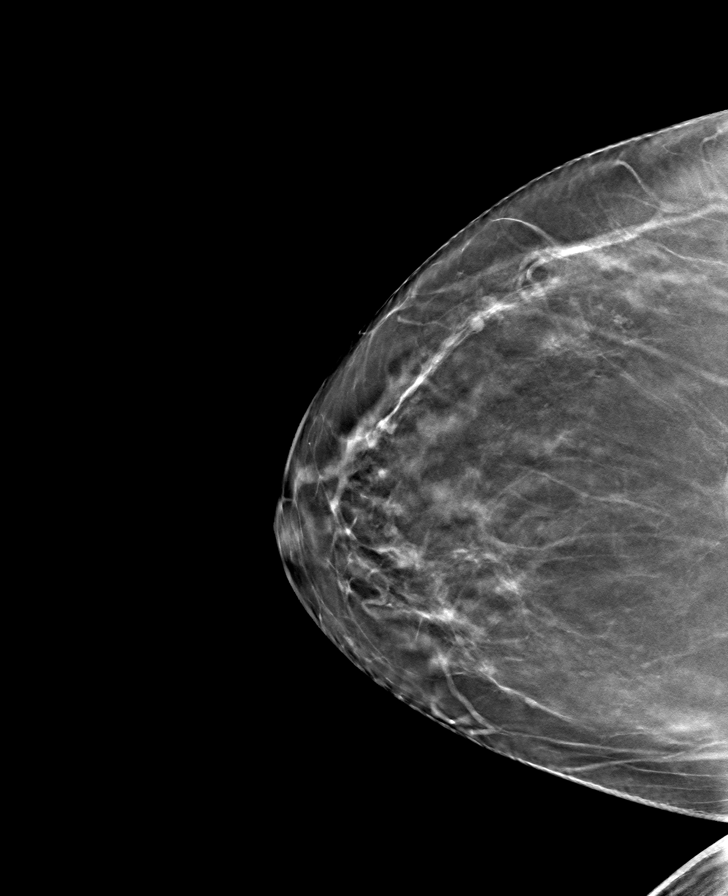

[L CC tomo · tomo slice 34/67.0]
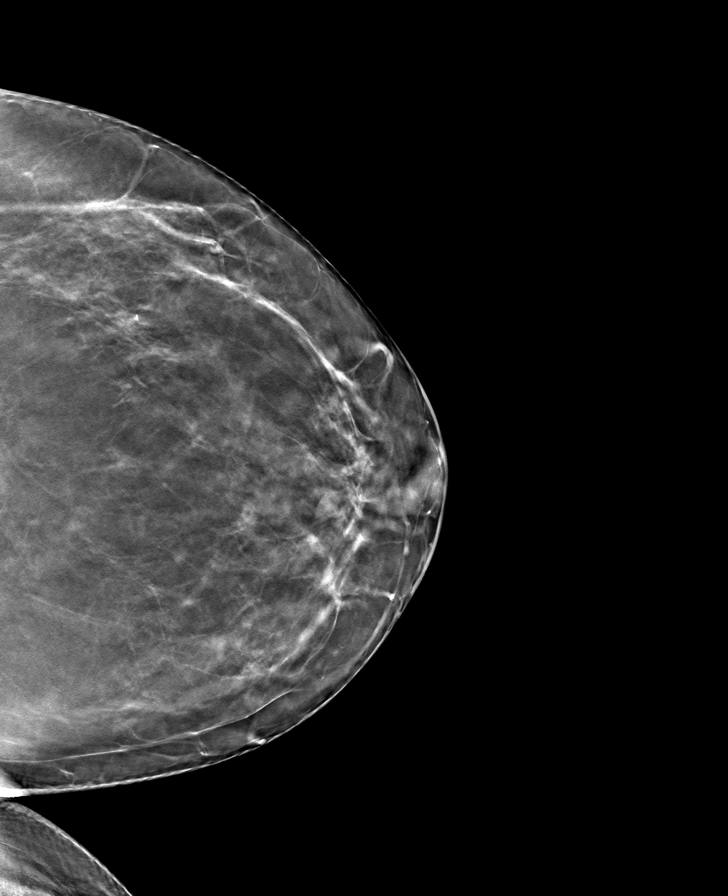

[8 of 24 positions shown; findings below may reference images not displayed]

ACR Breast Density Category b: There are scattered areas of
fibroglandular density.
FINDINGS: There are no findings suspicious for malignancy.
IMPRESSION: No mammographic evidence of malignancy. A result letter of this
screening mammogram will be mailed directly to the patient.

RECOMMENDATION:
Screening mammogram in one year. (Code:51-O-LD2)

BI-RADS CATEGORY  1: Negative.

## 2024-01-31 ENCOUNTER — Other Ambulatory Visit: Payer: Self-pay | Admitting: Dermatology

## 2024-01-31 DIAGNOSIS — L2089 Other atopic dermatitis: Secondary | ICD-10-CM

## 2024-04-02 ENCOUNTER — Encounter

## 2024-04-07 ENCOUNTER — Other Ambulatory Visit: Payer: Self-pay | Admitting: Dermatology

## 2024-04-07 DIAGNOSIS — L2089 Other atopic dermatitis: Secondary | ICD-10-CM

## 2024-04-08 ENCOUNTER — Ambulatory Visit
Admission: RE | Admit: 2024-04-08 | Discharge: 2024-04-08 | Disposition: A | Discharge status: Home / Self Care | Source: Ambulatory Visit | Attending: Internal Medicine | Admitting: Internal Medicine

## 2024-04-08 DIAGNOSIS — Z1231 Encounter for screening mammogram for malignant neoplasm of breast: Secondary | ICD-10-CM

## 2024-05-12 ENCOUNTER — Ambulatory Visit (INDEPENDENT_AMBULATORY_CARE_PROVIDER_SITE_OTHER): Admitting: Gastroenterology

## 2024-05-12 VITALS — BP 142/79 | HR 99 | Temp 98.4°F | Wt 208.0 lb

## 2024-05-12 DIAGNOSIS — Z860101 Personal history of adenomatous and serrated colon polyps: Secondary | ICD-10-CM

## 2024-05-12 DIAGNOSIS — K219 Gastro-esophageal reflux disease without esophagitis: Secondary | ICD-10-CM | POA: Diagnosis not present

## 2024-05-12 MED ORDER — DEXLANSOPRAZOLE 60 MG PO CPDR: 60.0000 mg | DELAYED_RELEASE_CAPSULE | Freq: Every day | ORAL | 2 refills | Status: DC

## 2024-05-12 NOTE — Progress Notes (Signed)
 Karma Oz, MD [address]  [address]  [place], [place] [address]  Main: [phone]  Fax: [phone]    Gastroenterology Consultation  Referring Provider:     Yehuda Helms, MD Primary Care Physician:  Yehuda Helms, MD Primary Gastroenterologist:  Dr. Karma Oz Reason for Consultation: Dyspepsia, heartburn        HPI:   Amber Mills is a 63 y.o. female referred by Dr. Claudius Cumins, Rosalynn Come, MD  for consultation & management of chronic upper abdominal pain.  She was last seen by me in 2021 for change in bowel habits.  She is here today to discuss about chronic upper abdominal pain, predominantly in the right upper quadrant more or less constant and sometimes across upper abdomen, burning in the stomach as well as heartburn.  She takes Protonix  40 mg 1-2 times daily.  She admits to drinking 6 bottles of Pepsi daily since her childhood.  She has been gaining weight.  Denies any diarrhea or constipation.  She does report mild lower abdominal discomfort which is intermittent associated with bloating.  She does not eat healthy.  She was evaluated at [facility] in 09/2018 for upper abdominal pain, and this was thought to be secondary to nonulcer dyspepsia.  She had multiple EGDs and colonoscopies in the past which were unremarkable. She had right upper quadrant ultrasound which revealed adenomyomatosis of the gallbladder but no evidence of cholelithiasis.  CT abdomen pelvis in 2022 was unremarkable.   Follow-up visit 05/12/2024 Amber Mills is here to discuss about ongoing symptoms of severe burning, worsening of heartburn, takes Protonix  40 mg twice daily.  She continues to drink 6 Cokes daily and she grew up drinking Coke since age of 1.  She does not follow healthy lifestyle.  She feels tired all the time.  She also told me that her friends told her to stop drinking soda and cut down sugars as well.  She does not have any other concerns today  NSAIDs:  None  Antiplts/Anticoagulants/Anti thrombotics: Plavix with history of coronary disease  GI Procedures: colonoscopy 02/12/12 and 06/12/15 showing a TA and HP's, respectively EGD 05/31/15 showing mild duodenitis; biopsies for negative for Celiac disease and H pylori  Colonoscopy 09/07/2020 - One 15 mm polyp in the transverse colon, removed with a hot snare. Resected and retrieved. Clip ( MR conditional) was placed. - One 6 mm polyp in the transverse colon, removed with a hot snare. Resected and retrieved. - One 15 mm polyp in the descending colon, removed with a hot snare. Resected and retrieved. - One 5 mm polyp in the transverse colon, removed with a cold snare. Resected and retrieved. - The distal rectum and anal verge are normal on retroflexion view. - Diverticulosis in the sigmoid colon.  DIAGNOSIS:  A. COLON POLYP X 2, TRANSVERSE; HOT SNARE AND COLD SNARE:  - TUBULOVILLOUS ADENOMA, PEDUNCULATED, 10 MM, WITH STALK MARGIN FREE OF  DYSPLASIA.  - TUBULAR ADENOMA, 3 MM, ADMIXED WITH FECAL MATERIAL.  - NEGATIVE FOR HIGH-GRADE DYSPLASIA AND MALIGNANCY.   B.  COLON POLYP X 2, DESCENDING; HOT SNARE AND COLD SNARE:  - TUBULAR ADENOMA.  - NEGATIVE FOR HIGH-GRADE DYSPLASIA AND MALIGNANCY.   Past Medical History:  Diagnosis Date   Basal cell carcinoma 11/12/2022   Right superior lateral tip of nose. Mohs with Dr. Debrah Fan.   Bipolar affective disorder (HCC)    GERD (gastroesophageal reflux disease)    Hyperlipemia    Hypertension  Myocardial infarction (HCC)    Panic attack     Past Surgical History:  Procedure Laterality Date   ABDOMINAL HYSTERECTOMY     bladder mesh     CARDIAC CATHETERIZATION     COLONOSCOPY WITH PROPOFOL  N/A 09/07/2020   Procedure: COLONOSCOPY WITH PROPOFOL ;  Surgeon: Selena Daily, MD;  Location: [facility] ENDOSCOPY;  Service: Gastroenterology;  Laterality: N/A;   CORONARY STENT INTERVENTION N/A 07/17/2017   Procedure: Coronary Stent Intervention;  Surgeon: Antonette Batters, MD;  Location: [facility] INVASIVE CV LAB;  Service: Cardiovascular;  Laterality: N/A;   HERNIA REPAIR     LEFT HEART CATH AND CORONARY ANGIOGRAPHY N/A 07/17/2017   Procedure: Left Heart Cath and Coronary Angiography and possible pci;  Surgeon: Antonette Batters, MD;  Location: [facility] INVASIVE CV LAB;  Service: Cardiovascular;  Laterality: N/A;   TONSILLECTOMY      Current Outpatient Medications:    aspirin  81 MG chewable tablet, Chew by mouth daily., Disp: , Rfl:    atorvastatin  (LIPITOR) 80 MG tablet, Take 1 tablet (80 mg total) by mouth daily at 6 PM., Disp: 30 tablet, Rfl: 0   bisoprolol-hydrochlorothiazide (ZIAC) 2.5-6.25 MG tablet, Take 1 tablet by mouth daily., Disp: , Rfl:    clonazePAM  (KLONOPIN ) 0.5 MG tablet, Take 0.25 mg by mouth 2 (two) times daily., Disp: , Rfl:    dexlansoprazole (DEXILANT) 60 MG capsule, Take 1 capsule (60 mg total) by mouth daily., Disp: 30 capsule, Rfl: 2   divalproex  (DEPAKOTE  ER) 250 MG 24 hr tablet, Take 250 mg by mouth daily., Disp: , Rfl:    metroNIDAZOLE  (METROGEL ) 0.75 % gel, Apply twice daily to face for rosacea, Disp: 45 g, Rfl: 2   mometasone  (ELOCON ) 0.1 % cream, APPLY TWICE DAILY TO AREA ON LEG UP TO 2 WEEKS ON 2 WEEKS OFF AS NEEDED., Disp: 45 g, Rfl: 1   olmesartan  (BENICAR ) 40 MG tablet, Take 40 mg by mouth daily., Disp: , Rfl:    Family History  Problem Relation Age of Onset   CAD Father    Breast cancer Maternal Aunt    Breast cancer Paternal Aunt      Social History   Tobacco Use   Smoking status: Former   Smokeless tobacco: Never  Vaping Use   Vaping status: Never Used  Substance Use Topics   Alcohol use: No   Drug use: No    Allergies as of 05/12/2024 - Review Complete 05/12/2024  Allergen Reaction Noted   Other Anaphylaxis 07/16/2017   Zofran  [ondansetron  hcl] Itching 07/17/2017   Buspirone  10/21/2018   Ciprofloxacin  Nausea And Vomiting 07/30/2021   Haloperidol  10/21/2018   Sertraline  10/21/2018    Sulfamethoxazole-trimethoprim Other (See Comments) 04/13/2022   Penicillins Rash 07/16/2017    Review of Systems:    All systems reviewed and negative except where noted in HPI.   Physical Exam:  BP (!) 142/79 (BP Location: Left Arm, Patient Position: Sitting, Cuff Size: Large)   Pulse 99   Temp 98.4 F (36.9 C) (Oral)   Wt 208 lb (94.3 kg)   BMI 39.30 kg/m  No LMP recorded. Patient has had a hysterectomy.  General:   Alert,  Well-developed, well-nourished, pleasant and cooperative in NAD Head:  Normocephalic and atraumatic. Eyes:  Sclera clear, no icterus.   Conjunctiva pink. Ears:  Normal auditory acuity. Nose:  No deformity, discharge, or lesions. Mouth:  No deformity or lesions,oropharynx pink & moist. Neck:  Supple; no masses or thyromegaly. Lungs:  Respirations even and unlabored.  Clear throughout to auscultation.   No wheezes, crackles, or rhonchi. No acute distress. Heart:  Regular rate and rhythm; no murmurs, clicks, rubs, or gallops. Abdomen:  Normal bowel sounds. Soft, non-tender and non-distended without masses, hepatosplenomegaly or hernias noted.  No guarding or rebound tenderness.   Rectal: Not performed Msk:  Symmetrical without gross deformities. Good, equal movement & strength bilaterally. Pulses:  Normal pulses noted. Extremities:  No clubbing or edema.  No cyanosis. Neurologic:  Alert and oriented x3;  grossly normal neurologically. Skin:  Intact without significant lesions or rashes. No jaundice. Psych:  Alert and cooperative. Normal mood and affect.  Imaging Studies: Reviewed  Assessment and Plan:   Amber Mills is a 63 y.o. female with history of coronary artery disease, status post PCI with stent placement on aspirin  81 mg daily is seen in consultation for right upper quadrant discomfort, symptoms of dyspepsia and GERD.  Her symptoms are secondary to poor eating habits and heavy intake of carbonated beverages.  Discussed with patient in length  regarding consumption of processed foods affecting gut microbiome, causes dysbiosis which in turn causes symptoms of dyspepsia, irritable bowel syndrome, triggers GERD.  I do not think her symptoms are biliary etiology.  No evidence of cholelithiasis.  Patient had EGD in 2016 and colonoscopy in 08/2020 which were unremarkable.  Switch to Dexilant 60 mg daily She is due for her surveillance colonoscopy, also recommend upper endoscopy after her follow-up with new cardiologist She will call our office back to schedule upper endoscopy and colonoscopy after seeing her cardiologist.  She is switching from current cardiologist to heart care Advised patient to incorporate physical activity, exercise 4-5 times a week Advised to wean off the carbonated beverages  Personal history of colon adenomas Recommend surveillance colonoscopy and patient is agreeable  Follow up as needed   Karma Oz, MD

## 2024-05-17 ENCOUNTER — Ambulatory Visit: Admitting: Gastroenterology

## 2024-05-20 ENCOUNTER — Encounter: Payer: Self-pay | Admitting: Dermatology

## 2024-05-20 ENCOUNTER — Ambulatory Visit (INDEPENDENT_AMBULATORY_CARE_PROVIDER_SITE_OTHER): Admitting: Dermatology

## 2024-05-20 DIAGNOSIS — M67471 Ganglion, right ankle and foot: Secondary | ICD-10-CM

## 2024-05-20 DIAGNOSIS — L608 Other nail disorders: Secondary | ICD-10-CM | POA: Diagnosis not present

## 2024-05-20 DIAGNOSIS — L821 Other seborrheic keratosis: Secondary | ICD-10-CM

## 2024-05-20 DIAGNOSIS — L719 Rosacea, unspecified: Secondary | ICD-10-CM

## 2024-05-20 DIAGNOSIS — M67449 Ganglion, unspecified hand: Secondary | ICD-10-CM

## 2024-05-20 MED ORDER — IVERMECTIN 1 % EX CREA: 1.0000 | TOPICAL_CREAM | Freq: Every day | CUTANEOUS | 11 refills | Status: AC

## 2024-05-20 NOTE — Progress Notes (Signed)
   Follow-Up Visit   Subjective  Amber Mills is a 63 y.o. female who presents for the following: cyst x 2-83m, R 2nd toe, just started hurting 2 days ago when hit top of toe, check spot L nose The patient has spots, moles and lesions to be evaluated, some may be new or changing and the patient may have concern these could be cancer.   The following portions of the chart were reviewed this encounter and updated as appropriate: medications, allergies, medical history  Review of Systems:  No other skin or systemic complaints except as noted in HPI or Assessment and Plan.  Objective  Well appearing patient in no apparent distress; mood and affect are within normal limits.    A focused examination was performed of the following areas: Bil feet, face, left ear,   Relevant exam findings are noted in the Assessment and Plan.    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 Condition is bothersome/symptomatic for patient. Currently flared.   Rosacea is a chronic progressive skin condition usually affecting the face of adults, causing redness and/or acne bumps. It is treatable but not curable. It sometimes affects the eyes (ocular rosacea) as well. It may respond to topical and/or systemic medication and can flare with stress, sun exposure, alcohol, exercise, topical steroids (including hydrocortisone/cortisone 10) and some foods.  Daily application of broad spectrum spf 30+ sunscreen to face is recommended to reduce flares.  Patient denies grittiness of the eyes  Treatment Plan Restart [facility] Triple Cream qhs  Long term medication management.  Patient is using long term (months to years) prescription medication  to  control their dermatologic condition.  These medications require periodic monitoring to evaluate for efficacy and side effects and may require periodic laboratory monitoring.   SEBORRHEIC KERATOSIS L ear - Stuck-on, waxy, tan-brown papules and/or plaques  - Benign-appearing - Discussed benign etiology and prognosis. - Observe - Call for any changes  Pincer nail vs less likely onychomycosis R 5th toenail > 4th  Exam: thin compressed nail plate of R 5th toe with slight subungual hyperkeratosis. Similar change on lateral R 4th nail plate  Treatment Plan: Began after injury to toe years ago per patient Check if shoes are too restrictive Benign appearing, observe    DIGITAL MUCOUS CYST   Related Procedures DG Foot Complete Right ROSACEA   SEBORRHEIC KERATOSES   PINCER NAIL DEFORMITY    Return for PRN pending results from X-Ray.  I, Rollie Clipper, RMA, am acting as scribe for Harris Liming, MD .   Documentation: I have reviewed the above documentation for accuracy and completeness, and I agree with the above.  Harris Liming, MD

## 2024-05-20 NOTE — Patient Instructions (Addendum)
 Instructions for [facility] Medications  One or more of your medications was sent to the [facility] mail order compounding pharmacy. You will receive an email from them and can purchase the medicine through that link. It will then be mailed to your home at the address you confirmed. If for any reason you do not receive an email from them, please check your spam folder. If you still do not find the email, please let us know. [facility] phone number is [phone].       Due to recent changes in healthcare laws, you may see results of your pathology and/or laboratory studies on MyChart before the doctors have had a chance to review them. We understand that in some cases there may be results that are confusing or concerning to you. Please understand that not all results are received at the same time and often the doctors may need to interpret multiple results in order to provide you with the best plan of care or course of treatment. Therefore, we ask that you please give [place] 2 business days to thoroughly review all your results before contacting the office for clarification. Should we see a critical lab result, you will be contacted sooner.   If You Need Anything After Your Visit  If you have any questions or concerns for your doctor, please call our main line at [phone] and press option 4 to reach your doctor's medical assistant. If no one answers, please leave a voicemail as directed and we will return your call as soon as possible. Messages left after 4 pm will be answered the following business day.   You may also send [place] a message via MyChart. We typically respond to MyChart messages within 1-2 business days.  For prescription refills, please ask your pharmacy to contact our office. Our fax number is [phone].  If you have an urgent issue when the clinic is closed that cannot wait until the next business day, you can page your doctor at the number below.    Please note  that while we do our best to be available for urgent issues outside of office hours, we are not available 24/7.   If you have an urgent issue and are unable to reach [place], you may choose to seek medical care at your doctor's office, retail clinic, urgent care center, or emergency room.  If you have a medical emergency, please immediately call 911 or go to the emergency department.  Pager Numbers  - Dr. Gwen Pounds: [phone]  - Dr. Roseanne Reno: [phone]  - Dr. Katrinka Blazing: [phone]   In the event of inclement weather, please call our main line at [phone] for an update on the status of any delays or closures.  Dermatology Medication Tips: Please keep the boxes that topical medications come in in order to help keep track of the instructions about where and how to use these. Pharmacies typically print the medication instructions only on the boxes and not directly on the medication tubes.   If your medication is too expensive, please contact our office at [phone] option 4 or send [place] a message through MyChart.   We are unable to tell what your co-pay for medications will be in advance as this is different depending on your insurance coverage. However, we may be able to find a substitute medication at lower cost or fill out paperwork to get insurance to cover a needed medication.   If a prior authorization is required to get your medication covered by your  insurance company, please allow [place] 1-2 business days to complete this process.  Drug prices often vary depending on where the prescription is filled and some pharmacies may offer cheaper prices.  The website www.goodrx.com contains coupons for medications through different pharmacies. The prices here do not account for what the cost may be with help from insurance (it may be cheaper with your insurance), but the website can give you the price if you did not use any insurance.  - You can print the associated coupon and take it with your  prescription to the pharmacy.  - You may also stop by our office during regular business hours and pick up a GoodRx coupon card.  - If you need your prescription sent electronically to a different pharmacy, notify our office through [facility] or by phone at [phone] option 4.     Si Usted Necesita Algo Despus de Su Visita  Tambin puede enviarnos un mensaje a travs de Clinical cytogeneticist. Por lo general respondemos a los mensajes de MyChart en el transcurso de 1 a 2 das hbiles.  Para renovar recetas, por favor pida a su farmacia que se ponga en contacto con nuestra oficina. Annie Sable de fax es [place] [phone].  Si tiene un asunto urgente cuando la clnica est cerrada y que no puede esperar hasta el siguiente da hbil, puede llamar/localizar a su doctor(a) al nmero que aparece a continuacin.   Por favor, tenga en cuenta que aunque hacemos todo lo posible para estar disponibles para asuntos urgentes fuera del horario de [place], no estamos disponibles las 24 horas del da, los 7 [address]  [address] de la [place].   Si tiene un problema urgente y no puede comunicarse con nosotros, puede optar por buscar atencin mdica  en el consultorio de su doctor(a), en una clnica privada, en un centro de atencin urgente o en una sala de emergencias.  Si tiene Engineer, drilling, por favor llame inmediatamente al 911 o vaya a la sala de emergencias.  Nmeros de bper  - Dr. Gwen Pounds: [phone]  - Dra. Roseanne Reno: [phone]  - Dr. Katrinka Blazing: [phone]   En caso de inclemencias del tiempo, por favor llame a Lacy Duverney principal al [phone] para una actualizacin sobre el [place] de cualquier retraso o cierre.  Consejos para la medicacin en dermatologa: Por favor, guarde las cajas en las que vienen los medicamentos de uso tpico para ayudarle a seguir las instrucciones sobre dnde y cmo usarlos. Las farmacias generalmente imprimen las instrucciones del medicamento slo en las cajas y no  directamente en los tubos del [place].   Si su medicamento es muy caro, por favor, pngase en contacto con Rolm Gala llamando al [phone] y presione la opcin 4 o envenos un mensaje a travs de Clinical cytogeneticist.   No podemos decirle cul ser su copago por los medicamentos por adelantado ya que esto es diferente dependiendo de la cobertura de su seguro. Sin embargo, es posible que podamos encontrar un medicamento sustituto a Audiological scientist un formulario para que el seguro cubra el medicamento que se considera necesario.   Si se requiere una autorizacin previa para que su compaa de seguros [place] su medicamento, por favor permtanos de 1 a 2 das hbiles para completar [address]  Los precios de los medicamentos varan con frecuencia dependiendo del Environmental consultant de dnde se surte la receta y alguna farmacias pueden ofrecer precios ms baratos.  El sitio web www.goodrx.com tiene cupones para medicamentos de Health and safety inspector. Los precios aqu no tienen  en cuenta lo que podra costar con la ayuda del seguro (puede ser ms barato con su seguro), pero el sitio web puede darle el precio si no Visual merchandiser.  - Puede imprimir el cupn correspondiente y llevarlo con su receta a la farmacia.  - Tambin puede pasar por nuestra oficina durante el horario de atencin regular y Education officer, museum una tarjeta de cupones de GoodRx.  - Si necesita que su receta se enve electrnicamente a una farmacia diferente, informe a nuestra oficina a travs de MyChart de [place] o por telfono llamando al [phone] y presione la opcin 4.

## 2024-06-09 ENCOUNTER — Ambulatory Visit
Admission: RE | Admit: 2024-06-09 | Discharge: 2024-06-09 | Disposition: A | Discharge status: Home / Self Care | Source: Ambulatory Visit | Attending: Dermatology | Admitting: Dermatology

## 2024-06-09 DIAGNOSIS — M67449 Ganglion, unspecified hand: Secondary | ICD-10-CM | POA: Diagnosis present

## 2024-06-10 ENCOUNTER — Ambulatory Visit: Payer: 59 | Admitting: Dermatology

## 2024-06-14 ENCOUNTER — Other Ambulatory Visit: Payer: Self-pay

## 2024-06-14 ENCOUNTER — Ambulatory Visit: Payer: Self-pay | Admitting: Dermatology

## 2024-06-14 DIAGNOSIS — M67449 Ganglion, unspecified hand: Secondary | ICD-10-CM

## 2024-07-02 ENCOUNTER — Other Ambulatory Visit: Payer: Self-pay

## 2024-07-02 ENCOUNTER — Emergency Department
Admission: EM | Admit: 2024-07-02 | Discharge: 2024-07-02 | Disposition: A | Discharge status: Home / Self Care | Attending: Emergency Medicine | Admitting: Emergency Medicine

## 2024-07-02 ENCOUNTER — Emergency Department

## 2024-07-02 DIAGNOSIS — R000 Tachycardia, unspecified: Secondary | ICD-10-CM | POA: Insufficient documentation

## 2024-07-02 DIAGNOSIS — R002 Palpitations: Secondary | ICD-10-CM | POA: Insufficient documentation

## 2024-07-02 DIAGNOSIS — I10 Essential (primary) hypertension: Secondary | ICD-10-CM | POA: Diagnosis not present

## 2024-07-02 DIAGNOSIS — I2510 Atherosclerotic heart disease of native coronary artery without angina pectoris: Secondary | ICD-10-CM | POA: Diagnosis not present

## 2024-07-02 DIAGNOSIS — R0602 Shortness of breath: Secondary | ICD-10-CM | POA: Insufficient documentation

## 2024-07-02 DIAGNOSIS — E876 Hypokalemia: Secondary | ICD-10-CM | POA: Insufficient documentation

## 2024-07-02 LAB — CBC WITH DIFFERENTIAL/PLATELET
Abs Immature Granulocytes: 0.03 K/uL (ref 0.00–0.07)
Basophils Absolute: 0 K/uL (ref 0.0–0.1)
Basophils Relative: 0 %
Eosinophils Absolute: 0.1 K/uL (ref 0.0–0.5)
Eosinophils Relative: 1 %
HCT: 41.2 % (ref 36.0–46.0)
Hemoglobin: 14.3 g/dL (ref 12.0–15.0)
Immature Granulocytes: 0 %
Lymphocytes Relative: 28 %
Lymphs Abs: 2.5 K/uL (ref 0.7–4.0)
MCH: 29.9 pg (ref 26.0–34.0)
MCHC: 34.7 g/dL (ref 30.0–36.0)
MCV: 86 fL (ref 80.0–100.0)
Monocytes Absolute: 0.6 K/uL (ref 0.1–1.0)
Monocytes Relative: 7 %
Neutro Abs: 5.7 K/uL (ref 1.7–7.7)
Neutrophils Relative %: 64 %
Platelets: 347 K/uL (ref 150–400)
RBC: 4.79 MIL/uL (ref 3.87–5.11)
RDW: 12.6 % (ref 11.5–15.5)
WBC: 8.8 K/uL (ref 4.0–10.5)
nRBC: 0 % (ref 0.0–0.2)

## 2024-07-02 LAB — BASIC METABOLIC PANEL WITH GFR
Anion gap: 12 (ref 5–15)
BUN: 19 mg/dL (ref 8–23)
CO2: 21 mmol/L — ABNORMAL LOW (ref 22–32)
Calcium: 9.1 mg/dL (ref 8.9–10.3)
Chloride: 108 mmol/L (ref 98–111)
Creatinine, Ser: 1.07 mg/dL — ABNORMAL HIGH (ref 0.44–1.00)
GFR, Estimated: 58 mL/min — ABNORMAL LOW (ref >60)
Glucose, Bld: 136 mg/dL — ABNORMAL HIGH (ref 70–99)
Potassium: 3.4 mmol/L — ABNORMAL LOW (ref 3.5–5.1)
Sodium: 141 mmol/L (ref 135–145)

## 2024-07-02 LAB — D-DIMER, QUANTITATIVE: D-Dimer, Quant: 0.51 ug{FEU}/mL — ABNORMAL HIGH (ref 0.00–0.50)

## 2024-07-02 LAB — BRAIN NATRIURETIC PEPTIDE: B Natriuretic Peptide: 47.5 pg/mL (ref 0.0–100.0)

## 2024-07-02 LAB — TROPONIN I (HIGH SENSITIVITY): Troponin I (High Sensitivity): 8 ng/L (ref <18)

## 2024-07-02 MED ORDER — IOHEXOL 350 MG/ML SOLN
100.0000 mL | Freq: Once | INTRAVENOUS | Status: AC | PRN
  Administered 2024-07-02: 100 mL via INTRAVENOUS

## 2024-07-02 NOTE — ED Notes (Signed)
 This RN changed pt after having a second episode of incontinence. Pt tolerated well. Pericare provided. NAD. Purse given at bedside.

## 2024-07-02 NOTE — ED Provider Notes (Signed)
 [facility] Provider Note    Event Date/Time   First MD Initiated Contact with Patient 07/02/24 1647     (approximate)   History   Palpitations   HPI  Amber Mills is a 63 y.o. female with a history of CAD s/p MI, GERD, hypertension, hyperlipidemia and bipolar disorder who presents with palpitations, acute onset today, occurring after she got into an argument with her son.  The patient states that her household is stressful due to mental health issues with a couple of her family members.  She reports feeling like her heart is racing.  She denies any associated chest pain.  She has had some shortness of breath recently with minimal exertion such as walking across the room, but this did not acutely worsened today.  She states the palpitations have somewhat improved now.  She denies any vomiting or diarrhea.  She has no leg swelling.   I reviewed the past medical records.  The patient was most recently seen by Dr. Auston from internal medicine on 5/12 for yearly evaluation.  She had no acute issues at that time.   Physical Exam   Triage Vital Signs: ED Triage Vitals  Encounter Vitals Group     BP 07/02/24 1642 (!) 155/71     Girls Systolic BP Percentile --      Girls Diastolic BP Percentile --      Boys Systolic BP Percentile --      Boys Diastolic BP Percentile --      Pulse Rate 07/02/24 1642 (!) 112     Resp 07/02/24 1642 13     Temp 07/02/24 1642 99.4 F (37.4 C)     Temp Source 07/02/24 1642 Oral     SpO2 07/02/24 1642 100 %     Weight 07/02/24 1643 207 lb (93.9 kg)     Height 07/02/24 1643 5' 3 (1.6 m)     Head Circumference --      Peak Flow --      Pain Score 07/02/24 1642 0     Pain Loc --      Pain Education --      Exclude from Growth Chart --     Most recent vital signs: Vitals:   07/02/24 1830 07/02/24 1923  BP: (!) 152/54   Pulse: (!) 105 86  Resp: 14 19  Temp:    SpO2: 100% 98%     General: Alert, slightly anxious  appearing,, no distress.  CV:  Good peripheral perfusion.  Borderline tachycardia, normal heart sounds. Resp:  Normal effort.  Lungs CTAB. Abd:  No distention.  Other:  No peripheral edema.   ED Results / Procedures / Treatments   Labs (all labs ordered are listed, but only abnormal results are displayed) Labs Reviewed  BASIC METABOLIC PANEL WITH GFR - Abnormal; Notable for the following components:      Result Value   Potassium 3.4 (*)    CO2 21 (*)    Glucose, Bld 136 (*)    Creatinine, Ser 1.07 (*)    GFR, Estimated 58 (*)    All other components within normal limits  D-DIMER, QUANTITATIVE - Abnormal; Notable for the following components:   D-Dimer, Quant 0.51 (*)    All other components within normal limits  CBC WITH DIFFERENTIAL/PLATELET  BRAIN NATRIURETIC PEPTIDE  TROPONIN I (HIGH SENSITIVITY)     EKG  ED ECG REPORT I, Waylon Cassis, the attending physician, personally viewed and interpreted this ECG.  Date: 07/02/2024 EKG Time: 1640 Rate: 110 Rhythm: Sinus tachycardia QRS Axis: normal Intervals: normal ST/T Wave abnormalities: normal Narrative Interpretation: no evidence of acute ischemia    RADIOLOGY  Chest x-ray: I independently viewed and interpreted the images; there is no focal consolidation or edema  CT angio chest:  IMPRESSION:  1. Negative examination for pulmonary embolism.  2. Mild paraseptal emphysema. Smoking related respiratory  bronchiolitis.  3. Coronary artery disease.  4. Hepatic steatosis.    Aortic Atherosclerosis (ICD10-I70.0) and Emphysema (ICD10-J43.9).    PROCEDURES:  Critical Care performed: No  Procedures   MEDICATIONS ORDERED IN ED: Medications  iohexol  (OMNIPAQUE ) 350 MG/ML injection 100 mL (100 mLs Intravenous Contrast Given 07/02/24 1955)     IMPRESSION / MDM / ASSESSMENT AND PLAN / ED COURSE  I reviewed the triage vital signs and the nursing notes.  63 year old female with PMH as noted above presents  with acute onset of palpitations today (although she has had some episodes like this before) as well as recent worsening of exertional shortness of breath, although this is more chronic.  On exam she has a borderline elevated temperature.  She was tachycardic to the 110s in triage but this has now improved.  Physical exam is otherwise unremarkable for acute findings.  Differential diagnosis includes, but is not limited to, acute anxiety, sinus tachycardia, dehydration, electrolyte abnormality, viral infection, less likely PE, cardiac dysrhythmia, CAD.  We will obtain lab workup including cardiac enzymes, BNP, D-dimer, chest x-ray, and reassess.  Patient's presentation is most consistent with acute presentation with potential threat to life or bodily function.  The patient is on the cardiac monitor to evaluate for evidence of arrhythmia and/or significant heart rate changes.  ----------------------------------------- 8:40 PM on 07/02/2024 -----------------------------------------  BMP and CBC showed no acute findings.  Troponin is negative.  Given the duration of the symptoms there is no indication for repeat.  BNP is also negative.  However, the D-dimer was minimally elevated.  We obtained a CT angio of the chest which is negative for PE although does show some possible emphysematous changes.  I feel that this likely explains some of the patient's more chronic respiratory symptoms.  Her acute tachycardia is most consistent with anxiety and possible dehydration/hypovolemia.  At this time, heart rate is down to the 70s and 80s.  The patient is asymptomatic.  She is stable for discharge home.  I counseled her on the results of the workup.  I gave strict return precautions, she expressed understanding   FINAL CLINICAL IMPRESSION(S) / ED DIAGNOSES   Final diagnoses:  Palpitations  Sinus tachycardia     Rx / DC Orders   ED Discharge Orders     None        Note:  This document was  prepared using Dragon voice recognition software and may include unintentional dictation errors.    Jacolyn Pae, MD 07/02/24 [phone]

## 2024-07-02 NOTE — ED Notes (Signed)
 Discharge instructions reviewed.   Opportunity for questions and concerns provided.   Alert, oriented and ambulatory.   Displays no signs of distress.   Encouraged to follow up with PCP as directed.

## 2024-07-02 NOTE — Discharge Instructions (Signed)
 Follow-up with your primary care provider as well as with the cardiologist as scheduled.  Return to the ER for new, worsening, or persistent severe palpitations, shortness of breath, chest pain, weakness or lightheadedness, or any other new or worsening symptoms that concern you.

## 2024-07-02 NOTE — ED Notes (Signed)
 This RN changed pts brief and provided pericare after a episode of incontinence. Pt was able to turn with ease. Tolerated well. NAD. CCB within reach.

## 2024-07-02 NOTE — ED Triage Notes (Signed)
 Pt BIB [facility] from home c/o new onset of palpations and SOB. Pt endorses getting into a argument with son earlier in the day, went home and stated feeling her  heart racing. HX of MI and stent placement. In 2019. Axo, NAD upon assessment.  160/90 110-140 pulse 100% RA

## 2024-07-07 NOTE — Progress Notes (Signed)
 ENCOUNTER: Patient Class :No patient class for patient encounter Department: [facility] CLINIC [address] [place] [address]  PATIENT: Patient Demographics      Name Patient ID SSN Gender Identity Birth Date   Brandan, Glauber [id number] Female 02/27/2061 (63 yrs)          Address Phone Email       [address] [place] [address] [phone] ([email or web address]            [place] Race         [facility] Caucasian/White             Reg Status PCP Date Last Verified Next Review Date     Verified Auston Reyes BIRCH [phone] 07/06/24 08/05/24           Marital Status Religion Language       Widowed Christian English              EMERGENCY CONTACT: Name Relationship Lgl Grd Work Administrator, sports  1. [employer]* Son or Daughter    [phone]    GUARANTOR: There is no guarantor information entered for this encounter.  COVERAGE: Primary Visit Coverage      Payer Plan Group Number Group Name Payer Phone Plan Phone   No coverage found                Secondary Visit Coverage      Payer Plan Group Number Group Name Payer Phone Plan Phone   No coverage found                Primary Coverage      Payer Plan Group Number Group Name Payer Phone Plan Phone   [facility] MEDICARE ADVANTAGE PLAN [facility] DUAL COMPLETE HMO POS SNP [phone]              Primary Subscriber      Subscriber ID Subscriber Name Subscriber [facility] Subscriber Address   [id number] [facility] E [id number] [address]      [place], [place] [address]           Secondary Coverage      Payer Plan Group Number Group Name Payer Phone Plan Phone   MEDICAID [facility] MEDICAID [id number]    [id number]           Secondary Subscriber      Subscriber ID Subscriber Name Subscriber [facility] Subscriber Address   [id number] CHAKITA, MCGRAW E [id number] [address]      [place], [place] [address]

## 2024-07-10 NOTE — Progress Notes (Unsigned)
 Cardiology Clinic Note   Date: 07/12/2024 ID: Amber Mills, DOB 20-Jan-1961, MRN [id number]  Primary Care Provider: Auston Reyes BIRCH, MD   Chief Complaint   Amber Mills is a 63 y.o. female who presents to the clinic today for new patient visit.   Patient Profile      Past medical history significant for: CAD. LHC 07/17/2017 (NSTEMI): Proximal to mid LAD 99%.  PCI with DES 2.5 x 23 mm to proximal LAD. Echo 10/15/2021 (performed at outside facility): EF > 55%, normal RV function.  No significant valvular abnormalities. Nuclear stress test 10/09/2021 (performed at outside facility): No evidence of ischemia. Hypertension. Hyperlipidemia. Lipid panel 05/11/2024: LDL 88, HDL 43, TG 139, total 158. Bipolar disorder. Anxiety/panic disorder. Former tobacco abuse. Fibromyalgia.   In summary, patient presented to the ED in July 2018 with chest pain and shortness of breath. Her troponin peaked at 2.99.  She underwent PCI with DES to LAD.  She was subsequently followed by Dr. Florencio.   Per review of notes, she underwent echo and nuclear stress testing in October 2022 as detailed above. She was last seen by Dr. Florencio on 07/11/2022.  Patient presented to the ED on 07/02/2024 for palpitations and shortness of breath following an argument with her son.  BP upon arrival 155/71, HR 112 bpm.  EKG showed sinus tachycardia with no acute changes.  D-dimer was slightly elevated.  CTA was negative for PE.  Troponin was negative, BNP normal.  Her heart rate improved and she was discharged home.     History of Present Illness    Today, patient presents for evaluation of shortness of breath and palpitations. Patient reports a 6 month history of shortness of breath with minimal exertion that resolves fairly quickly with rest. She states even rolling over in bed makes me short of breath. She denies lower extremity edema, orthopnea or PND. She also reports a 2-3 week history of palpitations described as  her heart beating really fast for 4-5 beats the resolving only to occur again. She states she will cough really hard to make it stop. 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 She admits to consuming a lot of coke. Since her ED visit she has tried to increase her water intake and feels the palpitations are somewhat improved.  She is mostly sedentary. She recently bought a stationary bicycle and walking pad but has not started to use either.     ROS: All other systems reviewed and are otherwise negative except as noted in History of Present Illness.  EKGs/Labs Reviewed    EKG Interpretation Date/Time:  Monday July 12 2024 14:38:07 EDT Ventricular Rate:  81 PR Interval:  158 QRS Duration:  68 QT Interval:  380 QTC Calculation: 441 R Axis:   5  Text Interpretation: Normal sinus rhythm Low voltage QRS Nonspecific T wave abnormality Confirmed by Amber Mills [phone]) on 07/12/2024 2:42:19 PM   07/02/2024: BUN 19; Creatinine, Ser 1.07; Potassium 3.4; Sodium 141   07/02/2024: Hemoglobin 14.3; WBC 8.8   07/02/2024: B Natriuretic Peptide 47.5    Physical Exam    VS:  BP 130/70   Pulse 81   Ht 5' 3 (1.6 m)   Wt 207 lb 9.6 oz (94.2 kg)   SpO2 96%   BMI 36.77 kg/m  , BMI Body mass index is 36.77 kg/m.  GEN: Well nourished, well developed, in no acute distress. Neck: No JVD or  carotid bruits. Cardiac:  RRR.  No murmur. No rubs or gallops.   Respiratory:  Respirations regular and unlabored. Clear to auscultation without rales, wheezing or rhonchi. GI: Soft, nontender, nondistended. Extremities: Radials/DP/PT 2+ and equal bilaterally. No clubbing or cyanosis. No edema  Skin: Warm and dry, no rash. Neuro: Strength intact.  Assessment & Plan   CAD/dyspnea S/p PCI with DES to proximal LAD.  Echo October 2022 showed normal LV/RV function and no significant valvular abnormalities.  Normal nuclear stress test October 2022.  Patient denies chest pain.  She reports a 6 month history of dyspnea with minimal exertion even rolling over in bed makes me short of breath. She states having similar shortness of breath prior to NSTEMI. She is mostly sedentary. She recently bought a stationary bike and walking pad to increase exercise but has not started to use either. EKG without acute changes.  - Schedule echo and nuclear stress test.  - Continue aspirin , atorvastatin , Ziac.  Palpitations Patient reports a 2-3 week history of palpitation described as feeling her heart beat fast for 4-5 beats resolve then return. She will cough really hard to make it stop. She admits to drinking a lot of coke throughout the day. She feels the palpitations are worse since the temperatures have been so high. Since her ED visit she has increased the amount of water she drinks and feels palpitations are somewhat better. Discussed ordering a monitor for further evaluation versus trying to decrease caffeine intake and see if palpitations improve further. She would like to defer the monitor for now.  - Will discuss Zio monitoring at follow up visit.   Hypertension BP today 130/70. No report of dizziness.  - Continue olmesartan  and Ziac.   Hyperlipidemia LDL 80 06 May 2024, not at goal. - Continue atorvastatin .  Disposition: Echo and nuclear stress test. Return in 4-6 weeks after testing or sooner as needed.      Informed Consent   Shared Decision Making/Informed Consent The risks [chest pain, shortness of breath, cardiac arrhythmias, dizziness, blood pressure fluctuations, myocardial infarction, stroke/transient ischemic attack, nausea, vomiting, allergic reaction, radiation exposure, metallic taste sensation and life-threatening complications (estimated to be 1 in 10,000)], benefits (risk stratification, diagnosing coronary artery disease, treatment guidance) and alternatives of a nuclear stress test were discussed in detail with Amber Mills and she agrees to proceed.       Signed, Amber Mills. Amber Ogren, DNP, NP-C

## 2024-07-12 ENCOUNTER — Ambulatory Visit: Admitting: Student

## 2024-07-12 ENCOUNTER — Encounter: Payer: Self-pay | Admitting: Student

## 2024-07-12 ENCOUNTER — Ambulatory Visit: Attending: Student | Admitting: Student

## 2024-07-12 VITALS — BP 130/70 | HR 81 | Ht 63.0 in | Wt 207.6 lb

## 2024-07-12 DIAGNOSIS — E785 Hyperlipidemia, unspecified: Secondary | ICD-10-CM | POA: Diagnosis not present

## 2024-07-12 DIAGNOSIS — I25118 Atherosclerotic heart disease of native coronary artery with other forms of angina pectoris: Secondary | ICD-10-CM | POA: Diagnosis not present

## 2024-07-12 DIAGNOSIS — R002 Palpitations: Secondary | ICD-10-CM | POA: Diagnosis not present

## 2024-07-12 DIAGNOSIS — R0609 Other forms of dyspnea: Secondary | ICD-10-CM

## 2024-07-12 NOTE — Patient Instructions (Signed)
 Medication Instructions:  Your physician recommends that you continue on your current medications as directed. Please refer to the Current Medication list given to you today.   *If you need a refill on your cardiac medications before your next appointment, please call your pharmacy*  Lab Work: None ordered at this time  If you have labs (blood work) drawn today and your tests are completely normal, you will receive your results only by: MyChart Message (if you have MyChart) OR A paper copy in the mail If you have any lab test that is abnormal or we need to change your treatment, we will call you to review the results.  Testing/Procedures: Your physician has requested that you have an echocardiogram. Echocardiography is a painless test that uses sound waves to create images of your heart. It provides your doctor with information about the size and shape of your heart and how well your heart's chambers and valves are working.   You may receive an ultrasound enhancing agent through an IV if needed to better visualize your heart during the echo. This procedure takes approximately one hour.  There are no restrictions for this procedure.  This will take place at [address] [facility]) [address], [place] [address]  Please note: We ask at that you not bring children with you during ultrasound (echo/ vascular) testing. Due to room size and safety concerns, children are not allowed in the ultrasound rooms during exams. Our front office staff cannot provide observation of children in our lobby area while testing is being conducted. An adult accompanying a patient to their appointment will only be allowed in the ultrasound room at the discretion of the ultrasound technician under special circumstances. We apologize for any inconvenience.   Your provider has ordered a Lexiscan/ Exercise Myoview Stress test. This will take place at [facility]. Please report to the [facility] medical mall entrance. The  volunteers at the first desk will direct you where to go.  [facility] MYOVIEW  Your provider has ordered a Stress Test with nuclear imaging. The purpose of this test is to evaluate the blood supply to your heart muscle. This procedure is referred to as a Non-Invasive Stress Test. This is because other than having an IV started in your vein, nothing is inserted or invades your body. Cardiac stress tests are done to find areas of poor blood flow to the heart by determining the extent of coronary artery disease (CAD). Some patients exercise on a treadmill, which naturally increases the blood flow to your heart, while others who are unable to walk on a treadmill due to physical limitations will have a pharmacologic/chemical stress agent called Lexiscan . This medicine will mimic walking on a treadmill by temporarily increasing your coronary blood flow.   Please note: these test may take anywhere between 2-4 hours to complete  How to prepare for your Myoview test:  Nothing to eat for 6 hours prior to the test No caffeine for 24 hours prior to test No smoking 24 hours prior to test. Your medication may be taken with water.  If your doctor stopped a medication because of this test, do not take that medication. Ladies, please do not wear dresses.  Skirts or pants are appropriate. Please wear a short sleeve shirt. No perfume, cologne or lotion. Wear comfortable walking shoes. No heels!   PLEASE NOTIFY THE OFFICE AT LEAST 24 HOURS IN ADVANCE IF YOU ARE UNABLE TO KEEP YOUR APPOINTMENT.  [phone] AND  PLEASE NOTIFY NUCLEAR MEDICINE  AT [facility] AT LEAST 24 HOURS IN ADVANCE IF YOU ARE UNABLE TO KEEP YOUR APPOINTMENT. [phone]   Follow-Up: At [facility], you and your health needs are our priority.  As part of our continuing mission to provide you with exceptional heart care, our providers are all part of one team.  This team includes your primary Cardiologist (physician) and Advanced Practice  Providers or APPs (Physician Assistants and Nurse Practitioners) who all work together to provide you with the care you need, when you need it.  Your next appointment:   4 week(s)  Provider:   Barnie Hila, NP    We recommend signing up for the patient portal called MyChart.  Sign up information is provided on this After Visit Summary.  MyChart is used to connect with patients for Virtual Visits (Telemedicine).  Patients are able to view lab/test results, encounter notes, upcoming appointments, etc.  Non-urgent messages can be sent to your provider as well.   To learn more about what you can do with MyChart, go to [email or web address].

## 2024-07-13 ENCOUNTER — Other Ambulatory Visit

## 2024-07-17 ENCOUNTER — Telehealth: Payer: Self-pay | Admitting: Home Health

## 2024-07-17 NOTE — Telephone Encounter (Signed)
 Patient's friend Sharlet called after-hours line today, reporting that patient has been feeling short of breath, not like herself, sweating a lot, and overall poor today.  She states patient had a heart attack in 2018, did not like her original cardiologist Dr. Florencio, recently transition to our practice.  She is very concerned about her symptoms and wonder if she had a heart attack.  She is asking what should patient do.   Given unclear if patient gives permission for discussion of her health problem with her friend, patient was called directly today.   Patient states that she has been feeling poor over the past 20-month with increased shortness of breath, intermittent heart palpitation.  She noted her heart rate was 120 today, after taking her blood pressure medicine, it was down to 60s.  She continued to feel poor without any improvement of her symptoms.  She states she has no chest pain.  She states she had a heart attack in 2018 and is very concerned that she has another heart attack.  She states she visited ER on 4th of July and was released home.  She states she supposed to see Dr. Gollan however was assigned to see a nurse practitioner instead.  She was not happy to see a provider who is not MD. She states her nuclear stress test and Echo are scheduled on 07/27/2024 and she felt this is too long for waiting.  She states she needs to find someone who would provide her a cardiac cath as she is very worried about her blockage disease.   Apologized to the patient that we have a wait for diagnostic workup.  I am not sure if this can be rescheduled to earlier dates.  I offered to send a message about her complaints to her cardiology office, and see if anything can be done.  I advised patient to go to the ER if her symptom is significantly troubling her with regular function, as I am not able to assess if she is having a heart attack over the phone.  She did not seem to be happy going to the ER.  Again this  was advised based off her symptom /complaints.

## 2024-07-20 ENCOUNTER — Telehealth: Payer: Self-pay | Admitting: Student

## 2024-07-20 ENCOUNTER — Telehealth: Payer: Self-pay | Admitting: Emergency Medicine

## 2024-07-20 NOTE — Telephone Encounter (Signed)
 New Message:      Patient said she was just seen on 07-12-24, but is still having problem. She wants to be seen asap.. She says she does not want to have another heart attack.     Patient c/o Palpitations:  STAT if patient reporting lightheadedness, shortness of breath, or chest pain  How long have you had palpitations/irregular HR/ Afib? Are you having the symptoms now? Palpitations, not at this time  Are you currently experiencing lightheadedness, SOB or CP? Short of breath, not at this time, especially when she move around- high heart rate- on July 4th it was 140 and July 12th it was 120   Do you have a history of afib (atrial fibrillation) or irregular heart rhythm?   Have you checked your BP or HR? (document readings if available):   Are you experiencing any other symptoms?  ,

## 2024-07-20 NOTE — Telephone Encounter (Signed)
-----   Message from Barnie Hila sent at 07/17/2024  9:07 PM EDT ----- Abby,   Will you please contact this patient and see if she is willing to go to a different location to do her stress test and echo and see if we can move it up from the 29th? Lesley is covering me so if you have any questions you can go to her or [place]. Apparently, she is upset that I did not agree to ordering a left heart cath for her. If she is insistent please see if you can get Gollan to see her sooner.  I will not be checking my box [place] is covering me for my vacation) so if you need me text me.   Thank you!  DW

## 2024-07-20 NOTE — Telephone Encounter (Signed)
 Called patient to schedule with Dr. Loistine, NP and she said that she only wanted Dr. Gollan. After letting her know that he does not have any soon availability, she said I will call [facility] then and hung up.

## 2024-07-20 NOTE — Telephone Encounter (Signed)
 Called and spoke with the patient. Patient feels that she is on a similar track as she was before she had her first heart attack. Patient feels that she needs to have a cath done to make sure there are no problems with her heart.  Patient was advised that she has a stress test scheduled for 7/29 (one week from now).  Patient states that she is willing to get the stress test done, but then she want to see a cardiologist and not a nurse. Patient was advised that Barnie Hila is a Publishing rights manager, which means she is an advance practice provider.  And the reason they see patients is because Dr. Gollan and other cardiologist do not have time to see all of the patients that need to be seen.  Patient states that she will call [facility] to see if she can be seen by a cardiologist there. Patient also states that she will keep the stress test appointment she has for now, but may change or cancel it depends on what she finds out from [place].

## 2024-07-21 ENCOUNTER — Ambulatory Visit: Admitting: Dermatology

## 2024-07-21 ENCOUNTER — Ambulatory Visit: Admitting: Internal Medicine

## 2024-07-27 ENCOUNTER — Ambulatory Visit: Payer: Self-pay | Admitting: Student

## 2024-07-27 ENCOUNTER — Other Ambulatory Visit: Payer: Self-pay | Admitting: Physician Assistant

## 2024-07-27 ENCOUNTER — Ambulatory Visit
Admission: RE | Admit: 2024-07-27 | Discharge: 2024-07-27 | Disposition: A | Discharge status: Home / Self Care | Source: Ambulatory Visit | Attending: Student | Admitting: Student

## 2024-07-27 DIAGNOSIS — R079 Chest pain, unspecified: Secondary | ICD-10-CM

## 2024-07-27 DIAGNOSIS — E785 Hyperlipidemia, unspecified: Secondary | ICD-10-CM | POA: Diagnosis present

## 2024-07-27 LAB — NM MYOCAR MULTI W/SPECT W/WALL MOTION / EF
LV dias vol: 49 mL (ref 46–106)
LV sys vol: 6 mL (ref 3.8–5.2)
MPHR: 155 {beats}/min
Nuc Stress EF: 89 %
Peak HR: 125 {beats}/min
Percent HR: 80 %
Rest HR: 93 {beats}/min
Rest Nuclear Isotope Dose: 10.7 mCi
SDS: 0
SRS: 0
SSS: 1
ST Depression (mm): 0 mm
Stress Nuclear Isotope Dose: 31.8 mCi
TID: 0.65

## 2024-07-27 MED ORDER — REGADENOSON 0.4 MG/5ML IV SOLN
0.4000 mg | Freq: Once | INTRAVENOUS | Status: AC
  Administered 2024-07-27: 0.4 mg via INTRAVENOUS

## 2024-07-27 MED ORDER — TECHNETIUM TC 99M TETROFOSMIN IV KIT
10.6500 | PACK | Freq: Once | INTRAVENOUS | Status: AC | PRN
  Administered 2024-07-27: 10.65 via INTRAVENOUS

## 2024-07-27 MED ORDER — TECHNETIUM TC 99M TETROFOSMIN IV KIT
31.8400 | PACK | Freq: Once | INTRAVENOUS | Status: AC | PRN
  Administered 2024-07-27: 31.84 via INTRAVENOUS

## 2024-07-27 NOTE — Progress Notes (Signed)
     Amber Mills presented for a nuclear stress test today.  I Lesley LITTIE Maffucci, PA-C, provided direct supervision and was present during the stress portion of the study today, which was completed without significant symptoms, immediate complications, or acute ST/T changes on ECG.  Stress imaging is pending at this time.  Preliminary ECG findings may be listed in the chart, but the stress test result will not be finalized until perfusion imaging is complete.  Lesley LITTIE Maffucci, PA-C  07/27/2024, 9:26 AM

## 2024-07-28 NOTE — Progress Notes (Signed)
 Last read by Siegfried FORBES Landsman at 8:33AM on 07/28/2024.

## 2024-07-29 ENCOUNTER — Ambulatory Visit: Admitting: Dermatology

## 2024-08-10 ENCOUNTER — Ambulatory Visit: Admitting: Dermatology

## 2024-08-10 DIAGNOSIS — L729 Follicular cyst of the skin and subcutaneous tissue, unspecified: Secondary | ICD-10-CM | POA: Diagnosis not present

## 2024-08-10 DIAGNOSIS — L570 Actinic keratosis: Secondary | ICD-10-CM | POA: Diagnosis not present

## 2024-08-10 DIAGNOSIS — L719 Rosacea, unspecified: Secondary | ICD-10-CM | POA: Diagnosis not present

## 2024-08-10 DIAGNOSIS — L578 Other skin changes due to chronic exposure to nonionizing radiation: Secondary | ICD-10-CM

## 2024-08-10 DIAGNOSIS — Z1283 Encounter for screening for malignant neoplasm of skin: Secondary | ICD-10-CM | POA: Diagnosis not present

## 2024-08-10 DIAGNOSIS — L820 Inflamed seborrheic keratosis: Secondary | ICD-10-CM

## 2024-08-10 DIAGNOSIS — Z7189 Other specified counseling: Secondary | ICD-10-CM

## 2024-08-10 DIAGNOSIS — L814 Other melanin hyperpigmentation: Secondary | ICD-10-CM

## 2024-08-10 DIAGNOSIS — D179 Benign lipomatous neoplasm, unspecified: Secondary | ICD-10-CM

## 2024-08-10 DIAGNOSIS — W908XXA Exposure to other nonionizing radiation, initial encounter: Secondary | ICD-10-CM

## 2024-08-10 DIAGNOSIS — Z85828 Personal history of other malignant neoplasm of skin: Secondary | ICD-10-CM

## 2024-08-10 NOTE — Patient Instructions (Addendum)
Counseling for BBL / IPL / Laser and Coordination of Care Discussed the treatment option of Broad Band Light (BBL) /Intense Pulsed Light (IPL)/ Laser for skin discoloration, including brown spots and redness.  Typically we recommend at least 1-3 treatment sessions about 5-8 weeks apart for best results.  Cannot have tanned skin when BBL performed, and regular use of sunscreen/photoprotection is advised after the procedure to help maintain results. The patient's condition may also require "maintenance treatments" in the future.  The fee for BBL / laser treatments is $350 per treatment session for the whole face.  A fee can be quoted for other parts of the body.  Insurance typically does not pay for BBL/laser treatments and therefore the fee is an out-of-pocket cost. Recommend prophylactic valtrex treatment. Once scheduled for procedure, will send Rx in prior to patient's appointment.   Cryotherapy Aftercare  Wash gently with soap and water everyday.   Apply Vaseline and Band-Aid daily until healed.   Due to recent changes in healthcare laws, you may see results of your pathology and/or laboratory studies on MyChart before the doctors have had a chance to review them. We understand that in some cases there may be results that are confusing or concerning to you. Please understand that not all results are received at the same time and often the doctors may need to interpret multiple results in order to provide you with the best plan of care or course of treatment. Therefore, we ask that you please give [place] 2 business days to thoroughly review all your results before contacting the office for clarification. Should we see a critical lab result, you will be contacted sooner.   If You Need Anything After Your Visit  If you have any questions or concerns for your doctor, please call our main line at [phone] and press option 4 to reach your doctor's medical assistant. If no one answers, please leave a  voicemail as directed and we will return your call as soon as possible. Messages left after 4 pm will be answered the following business day.   You may also send [place] a message via MyChart. We typically respond to MyChart messages within 1-2 business days.  For prescription refills, please ask your pharmacy to contact our office. Our fax number is [phone].  If you have an urgent issue when the clinic is closed that cannot wait until the next business day, you can page your doctor at the number below.    Please note that while we do our best to be available for urgent issues outside of office hours, we are not available 24/7.   If you have an urgent issue and are unable to reach [place], you may choose to seek medical care at your doctor's office, retail clinic, urgent care center, or emergency room.  If you have a medical emergency, please immediately call 911 or go to the emergency department.  Pager Numbers  - Dr. Gwen Pounds: [phone]  - Dr. Roseanne Reno: [phone]  - Dr. Katrinka Blazing: [phone]   In the event of inclement weather, please call our main line at [phone] for an update on the status of any delays or closures.  Dermatology Medication Tips: Please keep the boxes that topical medications come in in order to help keep track of the instructions about where and how to use these. Pharmacies typically print the medication instructions only on the boxes and not directly on the medication tubes.   If your medication is too expensive, please contact our office  at [phone] option 4 or send [place] a message through MyChart.   We are unable to tell what your co-pay for medications will be in advance as this is different depending on your insurance coverage. However, we may be able to find a substitute medication at lower cost or fill out paperwork to get insurance to cover a needed medication.   If a prior authorization is required to get your medication covered by your insurance  company, please allow [place] 1-2 business days to complete this process.  Drug prices often vary depending on where the prescription is filled and some pharmacies may offer cheaper prices.  The website www.goodrx.com contains coupons for medications through different pharmacies. The prices here do not account for what the cost may be with help from insurance (it may be cheaper with your insurance), but the website can give you the price if you did not use any insurance.  - You can print the associated coupon and take it with your prescription to the pharmacy.  - You may also stop by our office during regular business hours and pick up a GoodRx coupon card.  - If you need your prescription sent electronically to a different pharmacy, notify our office through [facility] or by phone at [phone] option 4.     Si Usted Necesita Algo Despus de Su Visita  Tambin puede enviarnos un mensaje a travs de Clinical cytogeneticist. Por lo general respondemos a los mensajes de MyChart en el transcurso de 1 a 2 das hbiles.  Para renovar recetas, por favor pida a su farmacia que se ponga en contacto con nuestra oficina. Annie Sable de fax es [place] [phone].  Si tiene un asunto urgente cuando la clnica est cerrada y que no puede esperar hasta el siguiente da hbil, puede llamar/localizar a su doctor(a) al nmero que aparece a continuacin.   Por favor, tenga en cuenta que aunque hacemos todo lo posible para estar disponibles para asuntos urgentes fuera del horario de [place], no estamos disponibles las 24 horas del da, los 7 [address]  [address] de la [place].   Si tiene un problema urgente y no puede comunicarse con nosotros, puede optar por buscar atencin mdica  en el consultorio de su doctor(a), en una clnica privada, en un centro de atencin urgente o en una sala de emergencias.  Si tiene Engineer, drilling, por favor llame inmediatamente al 911 o vaya a la sala de emergencias.  Nmeros de bper  - Dr.  Gwen Pounds: [phone]  - Dra. Roseanne Reno: [phone]  - Dr. Katrinka Blazing: [phone]   En caso de inclemencias del tiempo, por favor llame a Lacy Duverney principal al [phone] para una actualizacin sobre el [place] de cualquier retraso o cierre.  Consejos para la medicacin en dermatologa: Por favor, guarde las cajas en las que vienen los medicamentos de uso tpico para ayudarle a seguir las instrucciones sobre dnde y cmo usarlos. Las farmacias generalmente imprimen las instrucciones del medicamento slo en las cajas y no directamente en los tubos del [place].   Si su medicamento es muy caro, por favor, pngase en contacto con Rolm Gala llamando al [phone] y presione la opcin 4 o envenos un mensaje a travs de Clinical cytogeneticist.   No podemos decirle cul ser su copago por los medicamentos por adelantado ya que esto es diferente dependiendo de la cobertura de su seguro. Sin embargo, es posible que podamos encontrar un medicamento sustituto a Audiological scientist un formulario para que el seguro cubra el medicamento que  se considera necesario.   Si se requiere una autorizacin previa para que su compaa de seguros [place] su medicamento, por favor permtanos de 1 a 2 das hbiles para completar [address]  Los precios de los medicamentos varan con frecuencia dependiendo del Environmental consultant de dnde se surte la receta y alguna farmacias pueden ofrecer precios ms baratos.  El sitio web www.goodrx.com tiene cupones para medicamentos de Health and safety inspector. Los precios aqu no tienen en cuenta lo que podra costar con la ayuda del seguro (puede ser ms barato con su seguro), pero el sitio web puede darle el precio si no utiliz Tourist information centre manager.  - Puede imprimir el cupn correspondiente y llevarlo con su receta a la farmacia.  - Tambin puede pasar por nuestra oficina durante el horario de atencin regular y Education officer, museum una tarjeta de cupones de GoodRx.  - Si necesita que su receta se enve  electrnicamente a una farmacia diferente, informe a nuestra oficina a travs de MyChart de [place] o por telfono llamando al [phone] y presione la opcin 4.

## 2024-08-10 NOTE — Progress Notes (Signed)
 Follow-Up Visit   Subjective  Amber Mills is a 63 y.o. female who presents for the following: Skin Cancer Screening and Full Body Skin Exam, hx of BCC, patient c/o rosacea on her face treating with Skin medicinals triple cream with a fair response.  Patient c/o a growth on her toe 2nd toe she had a X-ray and was told- Xray with no focal lesions.   The patient presents for Total-Body Skin Exam (TBSE) for skin cancer screening and mole check. The patient has spots, moles and lesions to be evaluated, some may be new or changing and the patient may have concern these could be cancer.  The following portions of the chart were reviewed this encounter and updated as appropriate: medications, allergies, medical history  Review of Systems:  No other skin or systemic complaints except as noted in HPI or Assessment and Plan.  Objective  Well appearing patient in no apparent distress; mood and affect are within normal limits.  A full examination was performed including scalp, head, eyes, ears, nose, lips, neck, chest, axillae, abdomen, back, buttocks, bilateral upper extremities, bilateral lower extremities, hands, feet, fingers, toes, fingernails, and toenails. All findings within normal limits unless otherwise noted below.   Relevant physical exam findings are noted in the Assessment and Plan.  right wrist x 1, Stuck-on, waxy, tan-brown papule or plaque --Discussed benign etiology and prognosis.  left ear mid helix x 1 Erythematous thin papules/macules with gritty scale.   Assessment & Plan   SKIN CANCER SCREENING PERFORMED TODAY.  ACTINIC DAMAGE - Chronic condition, secondary to cumulative UV/sun exposure - diffuse scaly erythematous macules with underlying dyspigmentation - Recommend daily broad spectrum sunscreen SPF 30+ to sun-exposed areas, reapply every 2 hours as needed.  - Staying in the shade or wearing long sleeves, sun glasses (UVA+UVB protection) and wide brim hats (4-inch  brim around the entire circumference of the hat) are also recommended for sun protection.  - Call for new or changing lesions.  LENTIGINES, SEBORRHEIC KERATOSES, HEMANGIOMAS - Benign normal skin lesions - Benign-appearing - Call for any changes  MELANOCYTIC NEVI - Tan-brown and/or pink-flesh-colored symmetric macules and papules - Benign appearing on exam today - Observation - Call clinic for new or changing moles - Recommend daily use of broad spectrum spf 30+ sunscreen to sun-exposed areas.   ROSACEA Exam Mid face erythema with telangiectasias +/- scattered inflammatory papules Chronic and persistent condition with duration or expected duration over one year. Condition is symptomatic/ bothersome to patient. Not currently at goal.  Rosacea is a chronic progressive skin condition usually affecting the face of adults, causing redness and/or acne bumps. It is treatable but not curable. It sometimes affects the eyes (ocular rosacea) as well. It may respond to topical and/or systemic medication and can flare with stress, sun exposure, alcohol, exercise, topical steroids (including hydrocortisone/cortisone 10) and some foods.  Daily application of broad spectrum spf 30+ sunscreen to face is recommended to reduce flares. Treatment Plan Counseling for BBL / IPL / Laser and Coordination of Care Discussed the treatment option of Broad Band Light (BBL) /Intense Pulsed Light (IPL)/ Laser for skin discoloration, including brown spots and redness.  Typically we recommend at least 1-3 treatment sessions about 5-8 weeks apart for best results.  Cannot have tanned skin when BBL performed, and regular use of sunscreen/photoprotection is advised after the procedure to help maintain results. The patient's condition may also require maintenance treatments in the future.  The fee for BBL / laser treatments  is $350 per treatment session for the whole face.  A fee can be quoted for other parts of the body.   Insurance typically does not pay for BBL/laser treatments and therefore the fee is an out-of-pocket cost. Recommend prophylactic valtrex treatment. Once scheduled for procedure, will send Rx in prior to patient's appointment.      DIGITAL MUCOUS CYST vs OTHER R 2nd toe Exam: subQ fullness at proximal dorsal aspect Treatment Plan: We could refer to Podiatrist- patient declined  Lipoma  Exam: Subcutaneous rubbery nodule(s) Location: left knee Benign-appearing. Exam most consistent with an Lipoma. Discussed that a Lipoma is a benign fatty growth that can grow over time and sometimes become painful or otherwise symptomatic. Some patients may have one or several lipomas.. Benign Hereditary Lipomatosis is a hereditary familial condition where family members tend to grow multiple lipomas.  Recommend observation if it is not changing, growing or symptomatic. Recommend surgical excision to remove it if it is painful, growing, symptomatic, or other changes noted. Please contact our office for new or changing lesions so they can be evaluated.    HISTORY OF BASAL CELL CARCINOMA OF THE SKIN Right superior lateral tip of nose- Mohs Dr Bluford 2023 - No evidence of recurrence today - Recommend regular full body skin exams - Recommend daily broad spectrum sunscreen SPF 30+ to sun-exposed areas, reapply every 2 hours as needed.  - Call if any new or changing lesions are noted between office visits   INFLAMED SEBORRHEIC KERATOSIS right wrist x 1, Symptomatic, irritating, patient would like treated.  Destruction of lesion - right wrist x 1, Complexity: simple   Destruction method: cryotherapy   Informed consent: discussed and consent obtained   Timeout:  patient name, date of birth, surgical site, and procedure verified Lesion destroyed using liquid nitrogen: Yes   Region frozen until ice ball extended beyond lesion: Yes   Outcome: patient tolerated procedure well with no complications   Post-procedure  details: wound care instructions given    AK (ACTINIC KERATOSIS) left ear mid helix x 1 ACTINIC DAMAGE - chronic, secondary to cumulative UV radiation exposure/sun exposure over time - diffuse scaly erythematous macules with underlying dyspigmentation - Recommend daily broad spectrum sunscreen SPF 30+ to sun-exposed areas, reapply every 2 hours as needed.  - Recommend staying in the shade or wearing long sleeves, sun glasses (UVA+UVB protection) and wide brim hats (4-inch brim around the entire circumference of the hat). - Call for new or changing lesions.   If not gone in 2 months return to the office  Destruction of lesion - left ear mid helix x 1 Complexity: simple   Destruction method: cryotherapy   Informed consent: discussed and consent obtained   Timeout:  patient name, date of birth, surgical site, and procedure verified Lesion destroyed using liquid nitrogen: Yes   Region frozen until ice ball extended beyond lesion: Yes   Outcome: patient tolerated procedure well with no complications   Post-procedure details: wound care instructions given      Return in about 1 year (around 08/10/2025) for TBSE, hx of BCC.  IFay Kirks, CMA, am acting as scribe for Alm Rhyme, MD .   Documentation: I have reviewed the above documentation for accuracy and completeness, and I agree with the above.  Alm Rhyme, MD

## 2024-08-11 ENCOUNTER — Encounter: Payer: Self-pay | Admitting: Dermatology

## 2024-08-23 ENCOUNTER — Ambulatory Visit: Attending: Student

## 2024-08-23 DIAGNOSIS — R0609 Other forms of dyspnea: Secondary | ICD-10-CM

## 2024-08-23 LAB — ECHOCARDIOGRAM COMPLETE
AR max vel: 2.98 cm2
AV Area VTI: 2.92 cm2
AV Area mean vel: 2.85 cm2
AV Mean grad: 5 mmHg
AV Peak grad: 9.7 mmHg
Ao pk vel: 1.56 m/s
Area-P 1/2: 3.12 cm2
S' Lateral: 2.7 cm

## 2024-09-06 ENCOUNTER — Ambulatory Visit: Admitting: Student

## 2024-10-11 NOTE — Progress Notes (Unsigned)
 Cardiology Office Note  Date:  10/12/2024   ID:  Amber Mills, DOB 07-Mar-1961, MRN [id number]  PCP:  Auston Reyes BIRCH, MD   Chief Complaint  Patient presents with   PHQ-9 4 Week Follow-up    Discuss Myoview  & Echo results. Patient c/o more frequent PVC's.     HPI:  Amber FERGESON is a 63 y.o. female with past medical history of: CAD. LHC 07/17/2017 (NSTEMI): Proximal to mid LAD 99%.  PCI with DES 2.5 x 23 mm to proximal LAD. Echo 10/15/2021 (performed at outside facility): EF > 55%, normal RV function.  No significant valvular abnormalities. Nuclear stress test 10/09/2021 (performed at outside facility): No evidence of ischemia. Hypertension. Hyperlipidemia. Bipolar disorder. Anxiety/panic disorder. Former tobacco abuse. Quit 33 years ago Fibromyalgia.  Who presents for routine follow-up of her coronary artery disease  Previously seen by [facility] cardiology Seen by one of our providers July 12, 2024  presented to the ED in July 2018 with chest pain and shortness of breath. Her troponin peaked at 2.99.  She underwent PCI with DES to LAD.   ED on 07/02/2024 for palpitations and shortness of breath following an argument with her son.   Myoview  July 27, 2024 Low risk study, no significant ischemia  Echocardiogram July 12, 2024 Normal study  Inconsistent with her Lipitor, missing dose as of lipitor, pill too big  EKG personally reviewed by myself on todays visit EKG Interpretation Date/Time:  Tuesday October 12 2024 11:15:17 EDT Ventricular Rate:  53 PR Interval:  150 QRS Duration:  78 QT Interval:  438 QTC Calculation: 410 R Axis:   16  Text Interpretation: Sinus bradycardia When compared with ECG of 12-Jul-2024 14:38, Vent. rate has decreased BY  28 BPM Borderline criteria for Inferior infarct are no longer Present Confirmed by Chozen Latulippe [phone]) on 10/12/2024 11:54:43 AM    PMH:   has a past medical history of Basal cell carcinoma (11/12/2022), Bipolar  affective disorder (HCC), GERD (gastroesophageal reflux disease), Hyperlipemia, Hypertension, Myocardial infarction (HCC), and Panic attack.  PSH:    Past Surgical History:  Procedure Laterality Date   ABDOMINAL HYSTERECTOMY     bladder mesh     CARDIAC CATHETERIZATION     COLONOSCOPY WITH PROPOFOL  N/A 09/07/2020   Procedure: COLONOSCOPY WITH PROPOFOL ;  Surgeon: Unk Corinn Skiff, MD;  Location: [facility] ENDOSCOPY;  Service: Gastroenterology;  Laterality: N/A;   CORONARY STENT INTERVENTION N/A 07/17/2017   Procedure: Coronary Stent Intervention;  Surgeon: Florencio Cara BIRCH, MD;  Location: [facility] INVASIVE CV LAB;  Service: Cardiovascular;  Laterality: N/A;   HERNIA REPAIR     LEFT HEART CATH AND CORONARY ANGIOGRAPHY N/A 07/17/2017   Procedure: Left Heart Cath and Coronary Angiography and possible pci;  Surgeon: Florencio Cara BIRCH, MD;  Location: [facility] INVASIVE CV LAB;  Service: Cardiovascular;  Laterality: N/A;   TONSILLECTOMY      Current Outpatient Medications  Medication Sig Dispense Refill   aspirin  81 MG chewable tablet Chew by mouth daily.     atorvastatin  (LIPITOR) 80 MG tablet Take 1 tablet (80 mg total) by mouth daily at 6 PM. 30 tablet 0   bisoprolol-hydrochlorothiazide (ZIAC) 2.5-6.25 MG tablet Take 1 tablet by mouth daily.     Cholecalciferol (VITAMIN D3) 50 MCG (2000 UT) capsule Take 2,000 Units by mouth daily.     clonazePAM  (KLONOPIN ) 0.5 MG tablet Take 0.25 mg by mouth 2 (two) times daily.     divalproex  (DEPAKOTE  ER) 250 MG 24 hr tablet Take  250 mg by mouth daily.     Ivermectin  1 % CREA Apply 1 Application topically at bedtime. qhs to face for Rosacea 30 g 11   Multiple Vitamin (MULTI-VITAMIN PO) Take by mouth.     olmesartan  (BENICAR ) 40 MG tablet Take 40 mg by mouth daily.     pantoprazole  (PROTONIX ) 40 MG tablet Take 1 tablet by mouth 2 (two) times daily.     metroNIDAZOLE  (METROGEL ) 0.75 % gel Apply twice daily to face for rosacea (Patient not taking: Reported on  10/12/2024) 45 g 2   mometasone  (ELOCON ) 0.1 % cream APPLY TWICE DAILY TO AREA ON LEG UP TO 2 WEEKS ON 2 WEEKS OFF AS NEEDED. (Patient not taking: Reported on 10/12/2024) 45 g 1   No current facility-administered medications for this visit.    Allergies:   Other, Zofran  [ondansetron  hcl], Buspirone, Ciprofloxacin , Haloperidol, Sertraline, Sulfamethoxazole-trimethoprim, and Penicillins   Social History:  The patient  reports that she has quit smoking. She has never used smokeless tobacco. She reports that she does not drink alcohol and does not use drugs.   Family History:   family history includes Breast cancer in her maternal aunt and paternal aunt; CAD in her father.    Review of Systems: Review of Systems  Constitutional: Negative.   HENT: Negative.    Respiratory: Negative.    Cardiovascular: Negative.   Gastrointestinal: Negative.   Musculoskeletal: Negative.   Neurological: Negative.   Psychiatric/Behavioral: Negative.    All other systems reviewed and are negative.   PHYSICAL EXAM: VS:  BP 132/70 (BP Location: Left Arm, Patient Position: Sitting, Cuff Size: Normal)   Pulse (!) 53   Ht 5' 3 (1.6 m)   Wt 204 lb 4 oz (92.6 kg)   SpO2 97%   BMI 36.18 kg/m  , BMI Body mass index is 36.18 kg/m. GEN: Well nourished, well developed, in no acute distress HEENT: normal Neck: no JVD, carotid bruits, or masses Cardiac: RRR; no murmurs, rubs, or gallops,no edema  Respiratory:  clear to auscultation bilaterally, normal work of breathing GI: soft, nontender, nondistended, + BS MS: no deformity or atrophy Skin: warm and dry, no rash Neuro:  Strength and sensation are intact Psych: euthymic mood, full affect   Recent Labs: 07/02/2024: B Natriuretic Peptide 47.5; BUN 19; Creatinine, Ser 1.07; Hemoglobin 14.3; Platelets 347; Potassium 3.4; Sodium 141    Lipid Panel Lab Results  Component Value Date   CHOL 230 (H) 07/17/2017   HDL 41 07/17/2017   LDLCALC 160 (H) 07/17/2017    TRIG 145 07/17/2017      Wt Readings from Last 3 Encounters:  10/12/24 204 lb 4 oz (92.6 kg)  07/12/24 207 lb 9.6 oz (94.2 kg)  07/02/24 207 lb (93.9 kg)       ASSESSMENT AND PLAN:  Problem List Items Addressed This Visit       Cardiology Problems   Coronary artery disease of native artery of native heart with stable angina pectoris - Primary   Relevant Orders   EKG 12-Lead (Completed)   Hypertension   Relevant Orders   EKG 12-Lead (Completed)   Other Visit Diagnoses       Hyperlipidemia LDL goal <70         Palpitations       Relevant Orders   EKG 12-Lead (Completed)     DOE (dyspnea on exertion)          Coronary artery disease with stable angina Prior stent placed to LAD  2018 Recent normal echo and stress test No further workup needed  Hyperlipidemia Inconsistently taking Lipitor 80 daily as pill is too big Cholesterol above goal Recommend she change to a smaller pill, will discontinue Lipitor and start Crestor 40 daily Goal LDL less than 55  Essential hypertension Reports asymptomatic bradycardia on low-dose bisoprolol At her request refill provided Blood pressure is well controlled on today's visit. No changes made to the medications.  Anxiety/bipolar/fibromyalgia Managed by primary care    Signed, Velinda Lunger, M.D., Ph.D. [facility] [place], [place] [phone]

## 2024-10-12 ENCOUNTER — Ambulatory Visit: Attending: Cardiovascular Disease | Admitting: Cardiovascular Disease

## 2024-10-12 ENCOUNTER — Encounter: Payer: Self-pay | Admitting: Cardiovascular Disease

## 2024-10-12 VITALS — BP 132/70 | HR 53 | Ht 63.0 in | Wt 204.2 lb

## 2024-10-12 DIAGNOSIS — R002 Palpitations: Secondary | ICD-10-CM | POA: Diagnosis not present

## 2024-10-12 DIAGNOSIS — R0609 Other forms of dyspnea: Secondary | ICD-10-CM

## 2024-10-12 DIAGNOSIS — E785 Hyperlipidemia, unspecified: Secondary | ICD-10-CM

## 2024-10-12 DIAGNOSIS — I25118 Atherosclerotic heart disease of native coronary artery with other forms of angina pectoris: Secondary | ICD-10-CM | POA: Diagnosis not present

## 2024-10-12 DIAGNOSIS — I10 Essential (primary) hypertension: Secondary | ICD-10-CM

## 2024-10-12 MED ORDER — ROSUVASTATIN CALCIUM 40 MG PO TABS: 40.0000 mg | ORAL_TABLET | Freq: Every day | ORAL | 3 refills | Status: DC

## 2024-10-12 MED ORDER — BISOPROLOL-HYDROCHLOROTHIAZIDE 2.5-6.25 MG PO TABS: 1.0000 | ORAL_TABLET | Freq: Every day | ORAL | 3 refills | Status: DC

## 2024-10-12 NOTE — Patient Instructions (Addendum)
 Cut soda  Medication Instructions:   We have refilled the bisoprolol hydrochlorothiazide 2.5/6.25 daily  Stop atorvastatin  Start the crestor 40 mg daily  If you need a refill on your cardiac medications before your next appointment, please call your pharmacy.   Lab work: No new labs needed  Testing/Procedures: No new testing needed  Follow-Up: At [facility], you and your health needs are our priority.  As part of our continuing mission to provide you with exceptional heart care, we have created designated Provider Care Teams.  These Care Teams include your primary Cardiologist (physician) and Advanced Practice Providers (APPs -  Physician Assistants and Nurse Practitioners) who all work together to provide you with the care you need, when you need it.  You will need a follow up appointment in 12 months  Providers on your designated Care Team:   Lonni Meager, NP Bernardino Bring, PA-C Cadence [place], [place]  COVID-19 Vaccine Information can be found at: [email or web address] For questions related to vaccine distribution or appointments, please email [email or web address] or call [phone].

## 2024-12-03 ENCOUNTER — Telehealth: Payer: Self-pay | Admitting: Cardiovascular Disease

## 2024-12-03 NOTE — Telephone Encounter (Signed)
 Called patient and left message for call back.

## 2024-12-03 NOTE — Telephone Encounter (Signed)
 Patient c/o Palpitations:  STAT if patient reporting lightheadedness, shortness of breath, or chest pain  How long have you had palpitations/irregular HR/ Afib? Are you having the symptoms now? 3-4 months, no  Are you currently experiencing lightheadedness, SOB or CP? no  Do you have a history of afib (atrial fibrillation) or irregular heart rhythm? no  Have you checked your BP or HR? (document readings if available): 62 HR  Are you experiencing any other symptoms? no

## 2024-12-06 NOTE — Telephone Encounter (Signed)
 Patient returning call to nurse

## 2024-12-06 NOTE — Telephone Encounter (Signed)
 Left message for call back.

## 2024-12-07 ENCOUNTER — Encounter: Payer: Self-pay | Admitting: Cardiovascular Disease

## 2024-12-07 ENCOUNTER — Ambulatory Visit: Attending: Cardiovascular Disease | Admitting: Cardiovascular Disease

## 2024-12-07 ENCOUNTER — Ambulatory Visit

## 2024-12-07 VITALS — BP 160/70 | HR 60 | Ht 63.0 in | Wt 204.4 lb

## 2024-12-07 DIAGNOSIS — R0609 Other forms of dyspnea: Secondary | ICD-10-CM

## 2024-12-07 DIAGNOSIS — I10 Essential (primary) hypertension: Secondary | ICD-10-CM

## 2024-12-07 DIAGNOSIS — R002 Palpitations: Secondary | ICD-10-CM

## 2024-12-07 DIAGNOSIS — I25118 Atherosclerotic heart disease of native coronary artery with other forms of angina pectoris: Secondary | ICD-10-CM

## 2024-12-07 MED ORDER — BISOPROLOL-HYDROCHLOROTHIAZIDE 2.5-6.25 MG PO TABS: 2.0000 | ORAL_TABLET | Freq: Every day | ORAL | 3 refills | Status: AC

## 2024-12-07 NOTE — Patient Instructions (Addendum)
 Medication Instructions:   Please increase the bisoprolol -hydrochlorothiazide  up to 1 1/2 and if needed 2 a day  If you need a refill on your cardiac medications before your next appointment, please call your pharmacy.   Lab work: No new labs needed  Testing/Procedures:  Your physician has recommended that you wear a Zio monitor.   This monitor is a medical device that records the heart's electrical activity. Doctors most often use these monitors to diagnose arrhythmias. Arrhythmias are problems with the speed or rhythm of the heartbeat. The monitor is a small device applied to your chest. You can wear one while you do your normal daily activities. While wearing this monitor if you have any symptoms to push the button and record what you felt. Once you have worn this monitor for the period of time provider prescribed (Usually 14 days), you will return the monitor device in the postage paid box. Once it is returned they will download the data collected and provide us  with a report which the provider will then review and we will call you with those results. Important tips:  Avoid showering during the first 24 hours of wearing the monitor. Avoid excessive sweating to help maximize wear time. Do not submerge the device, no hot tubs, and no swimming pools. Keep any lotions or oils away from the patch. After 24 hours you may shower with the patch on. Take brief showers with your back facing the shower head.  Do not remove patch once it has been placed because that will interrupt data and decrease adhesive wear time. Push the button when you have any symptoms and write down what you were feeling. Once you have completed wearing your monitor, remove and place into box which has postage paid and place in your outgoing mailbox.  If for some reason you have misplaced your box then call our office and we can provide another box and/or mail it off for you.   Follow-Up: At [facility], you and your  health needs are our priority.  As part of our continuing mission to provide you with exceptional heart care, we have created designated Provider Care Teams.  These Care Teams include your primary Cardiologist (physician) and Advanced Practice Providers (APPs -  Physician Assistants and Nurse Practitioners) who all work together to provide you with the care you need, when you need it.  You will need a follow up appointment in 6 months, APP ok  Providers on your designated Care Team:   Lonni Meager, NP Bernardino Bring, PA-C Cadence [place], [place]  COVID-19 Vaccine Information can be found at: [email or web address] For questions related to vaccine distribution or appointments, please email vaccine@ [email or web address] or call [phone].

## 2024-12-07 NOTE — Progress Notes (Signed)
 Cardiology Office Note  Date:  12/07/2024   ID:  Amber Mills, DOB 13-Feb-1961, MRN [id number]  PCP:  Auston Reyes BIRCH, MD   Chief Complaint  Patient presents with   Palpitations    Patient c/o a thump in chest with dizziness.     HPI:  Amber Mills is a 63 y.o. female with past medical history of: CAD. LHC 07/17/2017 (NSTEMI): Proximal to mid LAD 99%.  PCI with DES 2.5 x 23 mm to proximal LAD. Echo 10/15/2021 (performed at outside facility): EF > 55%, normal RV function.  No significant valvular abnormalities. Nuclear stress test 10/09/2021 (performed at outside facility): No evidence of ischemia. Hypertension. Hyperlipidemia. Bipolar disorder. Anxiety/panic disorder. Former tobacco abuse. Quit 33 years ago Fibromyalgia.  Who presents for routine follow-up of her coronary artery disease  Having palpitations, lasting several seconds at a time Thumps, strong beats Sx over the past several weeks Moved recently end of October,  recent stress at home,  Recent cut in her monthly payments from the government, trying to do the same with less money coming in  Back on lipitor 80 daily , crestor  caused joint ache Switched back  Denies significant chest pain concerning for angina  EKG personally reviewed by myself on todays visit EKG Interpretation Date/Time:  Tuesday December 07 2024 10:46:48 EST Ventricular Rate:  60 PR Interval:  150 QRS Duration:  72 QT Interval:  436 QTC Calculation: 436 R Axis:   40  Text Interpretation: Normal sinus rhythm Normal ECG When compared with ECG of 12-Oct-2024 11:15, No significant change was found Confirmed by Perla Lye [phone]) on 12/07/2024 11:14:17 AM    presented to the ED in July 2018 with chest pain and shortness of breath. Her troponin peaked at 2.99.  She underwent PCI with DES to LAD.   ED on 07/02/2024 for palpitations and shortness of breath following an argument with her son.   Myoview  July 27, 2024 Low risk study,  no significant ischemia  Echocardiogram July 12, 2024 Normal study  Inconsistent with her Lipitor, missing dose as of lipitor, pill too big    PMH:   has a past medical history of Basal cell carcinoma (11/12/2022), Bipolar affective disorder (HCC), GERD (gastroesophageal reflux disease), Hyperlipemia, Hypertension, Myocardial infarction (HCC), and Panic attack.  PSH:    Past Surgical History:  Procedure Laterality Date   ABDOMINAL HYSTERECTOMY     bladder mesh     CARDIAC CATHETERIZATION     COLONOSCOPY WITH PROPOFOL  N/A 09/07/2020   Procedure: COLONOSCOPY WITH PROPOFOL ;  Surgeon: Unk Corinn Skiff, MD;  Location: [facility] ENDOSCOPY;  Service: Gastroenterology;  Laterality: N/A;   CORONARY STENT INTERVENTION N/A 07/17/2017   Procedure: Coronary Stent Intervention;  Surgeon: Florencio Cara BIRCH, MD;  Location: [facility] INVASIVE CV LAB;  Service: Cardiovascular;  Laterality: N/A;   HERNIA REPAIR     LEFT HEART CATH AND CORONARY ANGIOGRAPHY N/A 07/17/2017   Procedure: Left Heart Cath and Coronary Angiography and possible pci;  Surgeon: Florencio Cara BIRCH, MD;  Location: [facility] INVASIVE CV LAB;  Service: Cardiovascular;  Laterality: N/A;   TONSILLECTOMY      Current Outpatient Medications  Medication Sig Dispense Refill   aspirin  81 MG chewable tablet Chew by mouth daily.     atorvastatin  (LIPITOR) 80 MG tablet Take 80 mg by mouth daily.     ciprofloxacin  (CIPRO ) 500 MG tablet Take 500 mg by mouth 2 (two) times daily. for 7 days     clonazePAM  (KLONOPIN ) 0.5 MG  tablet Take 0.25 mg by mouth 2 (two) times daily.     divalproex  (DEPAKOTE  ER) 250 MG 24 hr tablet Take 250 mg by mouth daily.     Ivermectin  1 % CREA Apply 1 Application topically at bedtime. qhs to face for Rosacea 30 g 11   nystatin (MYCOSTATIN/NYSTOP) powder Apply 1 Application topically 2 (two) times daily.     olmesartan  (BENICAR ) 40 MG tablet Take 40 mg by mouth daily.     pantoprazole  (PROTONIX ) 40 MG tablet Take 1 tablet by  mouth 2 (two) times daily.     bisoprolol -hydrochlorothiazide  (ZIAC ) 2.5-6.25 MG tablet Take 2 tablets by mouth daily. 180 tablet 3   Cholecalciferol (VITAMIN D3) 50 MCG (2000 UT) capsule Take 2,000 Units by mouth daily. (Patient not taking: Reported on 12/07/2024)     metroNIDAZOLE  (METROGEL ) 0.75 % gel Apply twice daily to face for rosacea (Patient not taking: Reported on 12/07/2024) 45 g 2   mometasone  (ELOCON ) 0.1 % cream APPLY TWICE DAILY TO AREA ON LEG UP TO 2 WEEKS ON 2 WEEKS OFF AS NEEDED. (Patient not taking: Reported on 12/07/2024) 45 g 1   Multiple Vitamin (MULTI-VITAMIN PO) Take by mouth. (Patient not taking: Reported on 12/07/2024)     No current facility-administered medications for this visit.    Allergies:   Other, Zofran  [ondansetron  hcl], Buspirone, Ciprofloxacin , Haloperidol, Sertraline, Sulfamethoxazole-trimethoprim, and Penicillins   Social History:  The patient  reports that she has quit smoking. She has never used smokeless tobacco. She reports that she does not drink alcohol and does not use drugs.   Family History:   family history includes Breast cancer in her maternal aunt and paternal aunt; CAD in her father.    Review of Systems: Review of Systems  Constitutional: Negative.   HENT: Negative.    Respiratory: Negative.    Cardiovascular: Negative.   Gastrointestinal: Negative.   Musculoskeletal: Negative.   Neurological: Negative.   Psychiatric/Behavioral: Negative.    All other systems reviewed and are negative.   PHYSICAL EXAM: VS:  BP (!) 160/70 (BP Location: Left Arm, Patient Position: Sitting, Cuff Size: Large)   Pulse 60   Ht 5' 3 (1.6 m)   Wt 204 lb 6 oz (92.7 kg)   SpO2 98%   BMI 36.20 kg/m  , BMI Body mass index is 36.2 kg/m. GEN: Well nourished, well developed, in no acute distress HEENT: normal Neck: no JVD, carotid bruits, or masses Cardiac: RRR; no murmurs, rubs, or gallops,no edema  Respiratory:  clear to auscultation bilaterally,  normal work of breathing GI: soft, nontender, nondistended, + BS MS: no deformity or atrophy Skin: warm and dry, no rash Neuro:  Strength and sensation are intact Psych: euthymic mood, full affect   Recent Labs: 07/02/2024: B Natriuretic Peptide 47.5; BUN 19; Creatinine, Ser 1.07; Hemoglobin 14.3; Platelets 347; Potassium 3.4; Sodium 141    Lipid Panel Lab Results  Component Value Date   CHOL 230 (H) 07/17/2017   HDL 41 07/17/2017   LDLCALC 160 (H) 07/17/2017   TRIG 145 07/17/2017      Wt Readings from Last 3 Encounters:  12/07/24 204 lb 6 oz (92.7 kg)  10/12/24 204 lb 4 oz (92.6 kg)  07/12/24 207 lb 9.6 oz (94.2 kg)       ASSESSMENT AND PLAN:  Problem List Items Addressed This Visit       Cardiology Problems   Coronary artery disease of native artery of native heart with stable angina pectoris - Primary  Relevant Medications   atorvastatin  (LIPITOR) 80 MG tablet   bisoprolol -hydrochlorothiazide  (ZIAC ) 2.5-6.25 MG tablet   Other Relevant Orders   EKG 12-Lead (Completed)   Hypertension   Relevant Medications   atorvastatin  (LIPITOR) 80 MG tablet   bisoprolol -hydrochlorothiazide  (ZIAC ) 2.5-6.25 MG tablet   Other Relevant Orders   EKG 12-Lead (Completed)   Other Visit Diagnoses       Palpitations       Relevant Orders   EKG 12-Lead (Completed)   LONG TERM MONITOR (3-14 DAYS)     DOE (dyspnea on exertion)       Relevant Orders   EKG 12-Lead (Completed)      Palpitations Etiology unclear, possible SVT/atrial tach lasting several seconds at a time Also sounds like isolated strong beats, possibly PVCs Recommend she wear a ZIO monitor for further evaluation Suggest she increase her bisoprolol  HCTZ up to twice daily dosing She is nervous about running heart rate too low and will do extra half daily with slow titration upwards if tolerated  Coronary artery disease with stable angina Prior stent placed to LAD 2018 Recent normal echo and stress test Currently  with no symptoms of angina. No further workup at this time. Continue current medication regimen.  Hyperlipidemia Did not tolerate Crestor  secondary to joint ache, went back to Lipitor 80 daily  Essential hypertension Blood pressure running high, possibly exacerbated by stress Mild orthostasis pressure 140 supine down to 120 systolic with standing recovery after 3 minutes up to 140 systolic Initial pressure 160 systolic Recommend close monitoring her pressure at home  Anxiety/bipolar/fibromyalgia Managed by primary care Stress at home, financial, kids at home to 36 year old   Signed, Velinda Lunger, M.D., Ph.D. [facility] [place], [place] [phone]

## 2024-12-07 NOTE — Telephone Encounter (Signed)
 Called patient to review symptoms - palpitations have been happening since she had her stress test, recently more of a heavy thump, states she gets dizzy but has not passed out  Scheduled with Dr. Gollan 10:40 today

## 2024-12-28 ENCOUNTER — Telehealth: Payer: Self-pay | Admitting: Cardiovascular Disease

## 2024-12-28 ENCOUNTER — Encounter: Payer: Self-pay | Admitting: Cardiovascular Disease

## 2024-12-28 NOTE — Telephone Encounter (Signed)
"  error  "

## 2024-12-28 NOTE — Telephone Encounter (Signed)
 Pt c/o BP issue: STAT if pt c/o blurred vision, one-sided weakness or slurred speech.  STAT if BP is GREATER than 180/120 TODAY.  STAT if BP is LESS than 90/60 and SYMPTOMATIC TODAY  1. What is your BP concern?   Patient is concerned her BP would occasionally drop low  2. Have you taken any BP medication today?  Yes  3. What are your last 5 BP readings?  131/57  4. Are you having any other symptoms (ex. Dizziness, headache, blurred vision, passed out)? No  Patient stated 3 times within the last week and a half her BP had dropped down to 47 for her bottom number and noted she was not dizzy or felt like she was going to pass out at that time.    Patient is also following up on her heart monitor test results.

## 2024-12-28 NOTE — Telephone Encounter (Signed)
 Called patient. She states she thinks the low blood pressure reading on her monitor is faulty because she is having zero symptoms pertaining to the blood pressure. She is still having some palpitations and thumping feelings in her heart that she is relating to the PVC's and is anticipating the zio monitor to come back. Patient is just overly cautious since she has once had a heart attack and doesn't want anything to be wrong, which I understand completely. She is okay for now, told her heart monitor results should be back within the next couple weeks. Patient verbalized understanding, no further needs at this time.

## 2025-01-08 ENCOUNTER — Ambulatory Visit: Payer: Self-pay | Admitting: Cardiovascular Disease

## 2025-01-08 DIAGNOSIS — R002 Palpitations: Secondary | ICD-10-CM | POA: Diagnosis not present

## 2025-02-04 ENCOUNTER — Other Ambulatory Visit: Payer: Self-pay | Admitting: Internal Medicine

## 2025-02-04 DIAGNOSIS — Z7901 Long term (current) use of anticoagulants: Secondary | ICD-10-CM

## 2025-02-04 DIAGNOSIS — I10 Essential (primary) hypertension: Secondary | ICD-10-CM

## 2025-02-04 DIAGNOSIS — I25118 Atherosclerotic heart disease of native coronary artery with other forms of angina pectoris: Secondary | ICD-10-CM

## 2025-02-04 DIAGNOSIS — I214 Non-ST elevation (NSTEMI) myocardial infarction: Secondary | ICD-10-CM

## 2025-02-04 DIAGNOSIS — E782 Mixed hyperlipidemia: Secondary | ICD-10-CM

## 2025-08-10 ENCOUNTER — Ambulatory Visit: Admitting: Dermatology
